# Patient Record
Sex: Female | Born: 1935 | Race: White | Hispanic: No | Smoking: Never smoker
Health system: Southern US, Community
[De-identification: ages and names within clinical notes are randomized; demographics above are authoritative.]

## PROBLEM LIST (undated history)

## (undated) DIAGNOSIS — I10 Essential (primary) hypertension: Secondary | ICD-10-CM

## (undated) DIAGNOSIS — E119 Type 2 diabetes mellitus without complications: Secondary | ICD-10-CM

---

## 2008-06-10 ENCOUNTER — Emergency Department: Payer: Self-pay | Admitting: Emergency Medicine

## 2008-07-17 ENCOUNTER — Ambulatory Visit: Payer: Self-pay | Admitting: Family Medicine

## 2008-07-23 ENCOUNTER — Ambulatory Visit: Payer: Self-pay | Admitting: Family Medicine

## 2008-08-23 ENCOUNTER — Ambulatory Visit: Payer: Self-pay | Admitting: Family Medicine

## 2008-09-22 ENCOUNTER — Ambulatory Visit: Payer: Self-pay | Admitting: Family Medicine

## 2011-01-21 ENCOUNTER — Ambulatory Visit: Payer: Self-pay | Admitting: Ophthalmology

## 2013-08-17 ENCOUNTER — Observation Stay: Payer: Self-pay | Admitting: Internal Medicine

## 2013-08-17 LAB — TROPONIN I

## 2013-08-17 LAB — CBC
HCT: 40.1 % (ref 35.0–47.0)
HGB: 13.4 g/dL (ref 12.0–16.0)
MCH: 30.9 pg (ref 26.0–34.0)
MCHC: 33.4 g/dL (ref 32.0–36.0)
MCV: 93 fL (ref 80–100)
Platelet: 132 10*3/uL — ABNORMAL LOW (ref 150–440)
RBC: 4.33 10*6/uL (ref 3.80–5.20)
RDW: 14.9 % — ABNORMAL HIGH (ref 11.5–14.5)
WBC: 8.5 10*3/uL (ref 3.6–11.0)

## 2013-08-17 LAB — COMPREHENSIVE METABOLIC PANEL
ALBUMIN: 3.5 g/dL (ref 3.4–5.0)
ALK PHOS: 53 U/L
ALT: 20 U/L (ref 12–78)
Anion Gap: 4 — ABNORMAL LOW (ref 7–16)
BUN: 22 mg/dL — AB (ref 7–18)
Bilirubin,Total: 0.3 mg/dL (ref 0.2–1.0)
CHLORIDE: 100 mmol/L (ref 98–107)
CREATININE: 1.13 mg/dL (ref 0.60–1.30)
Calcium, Total: 8.8 mg/dL (ref 8.5–10.1)
Co2: 26 mmol/L (ref 21–32)
EGFR (Non-African Amer.): 45 — ABNORMAL LOW
GFR CALC AF AMER: 52 — AB
GLUCOSE: 150 mg/dL — AB (ref 65–99)
Osmolality: 267 (ref 275–301)
POTASSIUM: 3.9 mmol/L (ref 3.5–5.1)
SGOT(AST): 24 U/L (ref 15–37)
SODIUM: 130 mmol/L — AB (ref 136–145)
Total Protein: 7.2 g/dL (ref 6.4–8.2)

## 2013-08-17 LAB — URINALYSIS, COMPLETE
BACTERIA: NONE SEEN
Bilirubin,UR: NEGATIVE
Glucose,UR: NEGATIVE mg/dL (ref 0–75)
Leukocyte Esterase: NEGATIVE
Nitrite: NEGATIVE
PH: 5 (ref 4.5–8.0)
PROTEIN: NEGATIVE
RBC,UR: 1 /HPF (ref 0–5)
Red Blood Cell Cast: 2
SPECIFIC GRAVITY: 1.016 (ref 1.003–1.030)

## 2013-08-17 LAB — CK-MB
CK-MB: <0.5 ng/mL — ABNORMAL LOW (ref 0.5–3.6)
CK-MB: 1.2 ng/mL (ref 0.5–3.6)

## 2013-08-17 LAB — CK: CK, TOTAL: 47 U/L

## 2013-08-18 LAB — CBC WITH DIFFERENTIAL/PLATELET
BASOS PCT: 0.3 %
Basophil #: 0 10*3/uL (ref 0.0–0.1)
Eosinophil #: 0 10*3/uL (ref 0.0–0.7)
Eosinophil %: 0 %
HCT: 37.1 % (ref 35.0–47.0)
HGB: 12.3 g/dL (ref 12.0–16.0)
Lymphocyte #: 0.9 10*3/uL — ABNORMAL LOW (ref 1.0–3.6)
Lymphocyte %: 15.4 %
MCH: 30.9 pg (ref 26.0–34.0)
MCHC: 33.3 g/dL (ref 32.0–36.0)
MCV: 93 fL (ref 80–100)
Monocyte #: 0.3 x10 3/mm (ref 0.2–0.9)
Monocyte %: 5.4 %
NEUTROS ABS: 4.4 10*3/uL (ref 1.4–6.5)
Neutrophil %: 78.9 %
PLATELETS: 115 10*3/uL — AB (ref 150–440)
RBC: 3.98 10*6/uL (ref 3.80–5.20)
RDW: 14.9 % — AB (ref 11.5–14.5)
WBC: 5.6 10*3/uL (ref 3.6–11.0)

## 2013-08-18 LAB — MAGNESIUM: Magnesium: 1.9 mg/dL

## 2013-08-18 LAB — BASIC METABOLIC PANEL
Anion Gap: 5 — ABNORMAL LOW (ref 7–16)
BUN: 25 mg/dL — AB (ref 7–18)
CALCIUM: 8.4 mg/dL — AB (ref 8.5–10.1)
CREATININE: 1.09 mg/dL (ref 0.60–1.30)
Chloride: 110 mmol/L — ABNORMAL HIGH (ref 98–107)
Co2: 25 mmol/L (ref 21–32)
EGFR (African American): 55 — ABNORMAL LOW
EGFR (Non-African Amer.): 47 — ABNORMAL LOW
Glucose: 156 mg/dL — ABNORMAL HIGH (ref 65–99)
OSMOLALITY: 287 (ref 275–301)
POTASSIUM: 4.3 mmol/L (ref 3.5–5.1)
Sodium: 140 mmol/L (ref 136–145)

## 2013-08-18 LAB — RAPID INFLUENZA A&B ANTIGENS

## 2014-09-15 NOTE — Discharge Summary (Signed)
PATIENT NAME:  Amy Reid, Ripley R MR#:  [id number] DATE OF BIRTH:  02/12/1930  DATE OF ADMISSION:  08/17/2013 DATE OF DISCHARGE:  08/18/2013  PRIMARY CARE PHYSICIAN: Sherrine MaplesGlenn R. Beckey DowningWillett, MD  DISCHARGE DIAGNOSES: 1.  Acute bronchitis.  2.  Influenza B. 3.  Dehydration.  4.  Hypertension.  5.  Hyperlipidemia.   CONDITION: Stable.   CODE STATUS: Full code.   HOME MEDICATIONS: Please refer to the medication reconciliation list.   DIET: Low-sodium diet.   ACTIVITY: As tolerated.   FOLLOWUP CARE: Follow up with PCP within 1 to 2 weeks.   REASON FOR ADMISSION: Dizziness and weakness.   HOSPITAL COURSE: The patient is an 81 year old Caucasian female with a history of hypertension, hyperlipidemia, presented to the ED with dizziness and weakness for 4 days. In addition, the patient has been having trouble with cough, congestion, and chest cold. She went to PCP and was placed with Augmentin. The patient took Augmentin 3 days ago, but feels it was too strong for her and her cough has not gotten better. The patient also complained of more weakness and dizziness. The patient's CAT scan of head was negative in ED. The patient's systolic blood pressure dropped from 150 to 120 on standing up. For detailed history and physical examination, please refer to record the admission note dictated by Dr. Donnita Fallsadhika. Laboratory data on admission date showed CBC and BMP were normal. Laboratory data on admission date showed BUN 22, creatinine 1.13, sodium 130. WBC 8.5, hemoglobin 13.4, platelets 132. CAT scan of head did not show chronic changes. Initial influenza test was negative; however, laboratory staff called today informing us patient has positive influenza B. The patient's urinalysis is negative.  1.  Acute bronchitis with influenza B. The patient has been treated with Levaquin for the past 2 days with p.r.n. cough medication. We will discontinue Levaquin. The patient looks like already has influenza B for more  than 5 days. There is no indication to start Tamiflu. In addition, the patient's symptoms have been improving after admission.  2.  Weakness, dizziness, possibly due to dehydration. The patient's creatinine is normal; however, BUN is a 22 and 25. The patient has been treated with IV fluids.  3.  Hypertension. Initially, the patient's lisinopril was on hold due to orthostatic hypotension. The patient's blood pressure increased to 160. We will resume lisinopril.   The patient has no complaints. Her vital signs are stable. She is clinically stable and will be discharged to home today. I discussed the patient's discharge plan with the patient and the patient's son, nurse and case manager.   TIME SPENT: About 36 minutes.   ____________________________ Shaune PollackQing Ellyse Rotolo, MD qc:jcm D: 08/18/2013 15:53:56 ET T: 08/18/2013 21:41:46 ET JOB#: [id number]  cc: Shaune PollackQing Aydin Cavalieri, MD, <Dictator> Shaune PollackQING Paislynn Hegstrom MD ELECTRONICALLY SIGNED 08/19/2013 14:19
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
## 2015-07-12 ENCOUNTER — Ambulatory Visit
Admission: RE | Admit: 2015-07-12 | Discharge: 2015-07-12 | Disposition: A | Discharge status: Home / Self Care | Payer: Medicare Other | Source: Ambulatory Visit | Attending: Family Medicine | Admitting: Family Medicine

## 2015-07-12 ENCOUNTER — Other Ambulatory Visit: Payer: Self-pay | Admitting: Family Medicine

## 2015-07-12 DIAGNOSIS — R4701 Aphasia: Secondary | ICD-10-CM

## 2016-12-17 ENCOUNTER — Emergency Department: Payer: Medicare Other

## 2016-12-17 ENCOUNTER — Encounter: Payer: Self-pay | Admitting: Emergency Medicine

## 2016-12-17 ENCOUNTER — Emergency Department
Admission: EM | Admit: 2016-12-17 | Discharge: 2016-12-17 | Disposition: A | Discharge status: Home / Self Care | Payer: Medicare Other | Attending: Emergency Medicine | Admitting: Emergency Medicine

## 2016-12-17 DIAGNOSIS — R531 Weakness: Secondary | ICD-10-CM | POA: Insufficient documentation

## 2016-12-17 DIAGNOSIS — E119 Type 2 diabetes mellitus without complications: Secondary | ICD-10-CM | POA: Insufficient documentation

## 2016-12-17 DIAGNOSIS — G459 Transient cerebral ischemic attack, unspecified: Secondary | ICD-10-CM | POA: Diagnosis not present

## 2016-12-17 DIAGNOSIS — I10 Essential (primary) hypertension: Secondary | ICD-10-CM | POA: Diagnosis not present

## 2016-12-17 DIAGNOSIS — R4701 Aphasia: Secondary | ICD-10-CM | POA: Diagnosis present

## 2016-12-17 DIAGNOSIS — Z7984 Long term (current) use of oral hypoglycemic drugs: Secondary | ICD-10-CM | POA: Insufficient documentation

## 2016-12-17 HISTORY — DX: Type 2 diabetes mellitus without complications: E11.9

## 2016-12-17 HISTORY — DX: Essential (primary) hypertension: I10

## 2016-12-17 LAB — CBC
HCT: 39 % (ref 35.0–47.0)
Hemoglobin: 13.3 g/dL (ref 12.0–16.0)
MCH: 31.3 pg (ref 26.0–34.0)
MCHC: 34 g/dL (ref 32.0–36.0)
MCV: 92.3 fL (ref 80.0–100.0)
PLATELETS: 217 10*3/uL (ref 150–440)
RBC: 4.23 MIL/uL (ref 3.80–5.20)
RDW: 14 % (ref 11.5–14.5)
WBC: 8.2 10*3/uL (ref 3.6–11.0)

## 2016-12-17 LAB — BASIC METABOLIC PANEL
Anion gap: 8 (ref 5–15)
BUN: 28 mg/dL — ABNORMAL HIGH (ref 6–20)
CO2: 27 mmol/L (ref 22–32)
CREATININE: 1.09 mg/dL — AB (ref 0.44–1.00)
Calcium: 9.9 mg/dL (ref 8.9–10.3)
Chloride: 104 mmol/L (ref 101–111)
GFR calc non Af Amer: 45 mL/min — ABNORMAL LOW (ref >60)
GFR, EST AFRICAN AMERICAN: 52 mL/min — AB (ref >60)
Glucose, Bld: 110 mg/dL — ABNORMAL HIGH (ref 65–99)
Potassium: 4.3 mmol/L (ref 3.5–5.1)
Sodium: 139 mmol/L (ref 135–145)

## 2016-12-17 LAB — GLUCOSE, CAPILLARY: Glucose-Capillary: 116 mg/dL — ABNORMAL HIGH (ref 65–99)

## 2016-12-17 LAB — TROPONIN I: Troponin I: <0.03 ng/mL (ref <0.03)

## 2016-12-17 MED ORDER — ASPIRIN 81 MG PO CHEW
324.0000 mg | CHEWABLE_TABLET | Freq: Once | ORAL | Status: AC
  Administered 2016-12-17: 324 mg via ORAL
  Filled 2016-12-17: qty 4

## 2016-12-17 NOTE — ED Notes (Signed)
MD updated patient that nurse was in with a critical patient.

## 2016-12-17 NOTE — ED Triage Notes (Signed)
Pt reports episode of sudden onset incoherent speech that lasted approximately 30 minutes yesterday at 2:30 pm. Denies headache. Speech clear in triage. Pt reports also had elevated BP when checked at home. Pt boyfriend reports CBG 151 at home. Pt reports feeling weak. Bilateral strong hand grips, facial symmetry intact. Gait steady.

## 2016-12-17 NOTE — Discharge Instructions (Signed)
Take a baby aspirin every day. If you have any numbness or weakness or difficulty speaking, or any other new or worrisome symptoms, return immediately to the emergency department.

## 2016-12-17 NOTE — ED Notes (Signed)
Patient did not have to urinate at this time but was given a specimen cup to collect a sample when available.

## 2016-12-17 NOTE — ED Provider Notes (Addendum)
[facility] Emergency Department Provider Note  ____________________________________________   I have reviewed the triage vital signs and the nursing notes.   HISTORY  Chief Complaint Weakness; Hypertension; and Possible CVA    HPI Amy Reid is a 83 y.o. female who presents today with no complaint at this time but states that yesterday, for about a half hour around 6 PM, she had garbled speech and difficulty with word fine. Her colleague, who was with her, states that she was having great deal difficulty speaking. She was not otherwise disoriented however and did not have any focal numbness or weakness. About 30 minutes after thisshe was back to normal and has been since. She did notice her blood pressure was mildly elevated she states she was under increased stress. She has no complaints at this time. Symptoms resolved on their own nothing made it better and nothing made it worse. She had no pain.    Past Medical History:  Diagnosis Date  . Diabetes mellitus without complication (HCC)   . Hypertension     There are no active problems to display for this patient.   No past surgical history on file.  Prior to Admission medications   Medication Sig Start Date End Date Taking? Authorizing Provider  cetirizine (ZYRTEC) 10 MG tablet Take 10 mg by mouth daily as needed for allergies.   Yes [provider]  latanoprost (XALATAN) 0.005 % ophthalmic solution Place 1 drop into both eyes at bedtime.   Yes [provider]  lisinopril-hydrochlorothiazide (PRINZIDE,ZESTORETIC) 20-12.5 MG tablet Take 1 tablet by mouth at bedtime.  11/11/16  Yes [provider]  lovastatin (MEVACOR) 20 MG tablet Take 20 mg by mouth at bedtime.  11/11/16  Yes [provider]  metFORMIN (GLUCOPHAGE-XR) 500 MG 24 hr tablet Take 500 mg by mouth at bedtime.  10/13/16  Yes [provider]    Allergies Epinephrine  No family history on  file.  Social History Social History  Substance Use Topics  . Smoking status: Not on file  . Smokeless tobacco: Not on file  . Alcohol use Not on file    Review of Systems Constitutional: No fever/chills Eyes: No visual changes. ENT: No sore throat. No stiff neck no neck pain Cardiovascular: Denies chest pain. Respiratory: Denies shortness of breath. Gastrointestinal:   no vomiting.  No diarrhea.  No constipation. Genitourinary: Negative for dysuria. Musculoskeletal: Negative lower extremity swelling Skin: Negative for rash. Neurological: Negative for severe headaches, focal weakness or numbness.   ____________________________________________   PHYSICAL EXAM:  VITAL SIGNS: ED Triage Vitals [12/17/16 1021]  Enc Vitals Group     BP (!) 140/58     Pulse Rate 79     Resp 18     Temp 97.8 F (36.6 C)     Temp Source Oral     SpO2 95 %     Weight 125 lb (56.7 kg)     Height 5\' 4"  (1.626 m)     Head Circumference      Peak Flow      Pain Score      Pain Loc      Pain Edu?      Excl. in GC?     Constitutional: Alert and oriented. Well appearing and in no acute distress. Eyes: Conjunctivae are normal Head: Atraumatic HEENT: No congestion/rhinnorhea. Mucous membranes are moist.  Oropharynx non-erythematous Neck:   Nontender with no meningismus, no masses, no stridor Cardiovascular: Normal rate, regular rhythm. Grossly  normal heart sounds.  Good peripheral circulation. Respiratory: Normal respiratory effort.  No retractions. Lungs CTAB. Abdominal: Soft and nontender. No distention. No guarding no rebound Back:  There is no focal tenderness or step off.  there is no midline tenderness there are no lesions noted. there is no CVA tenderness Musculoskeletal: No lower extremity tenderness, no upper extremity tenderness. No joint effusions, no DVT signs strong distal pulses no edema Neurologic: Cranial nerves II through XII are grossly intact 5 out of 5 strength bilateral  upper and lower extremity. Finger to nose within normal limits heel to shin within normal limits, speech is normal with no word finding difficulty or dysarthria, reflexes symmetric, pupils are equally round and reactive to light, there is no pronator drift, sensation is normal, vision is intact to confrontation, gait is deferred, there is no nystagmus, normal neurologic exam Skin:  Skin is warm, dry and intact. No rash noted. Psychiatric: Mood and affect are normal. Speech and behavior are normal.  ____________________________________________   LABS (all labs ordered are listed, but only abnormal results are displayed)  Labs Reviewed  BASIC METABOLIC PANEL - Abnormal; Notable for the following:       Result Value   Glucose, Bld 110 (*)    BUN 28 (*)    Creatinine, Ser 1.09 (*)    GFR calc non Af Amer 45 (*)    GFR calc Af Amer 52 (*)    All other components within normal limits  GLUCOSE, CAPILLARY - Abnormal; Notable for the following:    Glucose-Capillary 116 (*)    All other components within normal limits  CBC  TROPONIN I  URINALYSIS, COMPLETE (UACMP) WITH MICROSCOPIC  CBG MONITORING, ED   ____________________________________________  EKG  I personally interpreted any EKGs ordered by me or triage  ____________________________________________  RADIOLOGY  I reviewed any imaging ordered by me or triage that were performed during my shift and, if possible, patient and/or family made aware of any abnormal findings. ____________________________________________   PROCEDURES  Procedure(s) performed: None  Procedures  Critical Care performed: None  ____________________________________________   INITIAL IMPRESSION / ASSESSMENT AND PLAN / ED COURSE  Pertinent labs & imaging results that were available during my care of the patient were reviewed by me and considered in my medical decision making (see chart for details).  Discussed with Dr. Thad Rangereynolds, she feels that the  patient's MRI is negative and given that she has an NIH stroke scale of 0 and this happened yesterday, Sunday the patient on baby aspirin and having close outpatient follow-up would not be an unreasonable thing as long as her MRI is negative. We will obtain MRI and reassess. ----------------------------------------- 3:44 PM on 12/17/2016 -----------------------------------------  , Son who is at bedside, states that she has had 2 prior episodes at least possibly 3 over the years always gets better, she is back to her baseline still, NIH stroke scale still 0, MRI negative, discussed with her primary care physician PA, who agrees with management and will see patient in clinic tomorrow. Patient will be given aspirin here, advised to take a baby aspirin every day and she will follow closely with primary care. Return precautions and follow-up given and understood   ____________________________________________   FINAL CLINICAL IMPRESSION(S) / ED DIAGNOSES  Final diagnoses:  None      This chart was dictated using voice recognition software.  Despite best efforts to proofread,  errors can occur which can change meaning.      Jeanmarie PlantMcShane, Sofi Bryars A,  MD 12/17/16 1246    Jeanmarie Plant, MD 12/17/16 [phone]

## 2019-04-15 ENCOUNTER — Inpatient Hospital Stay: Payer: Medicare Other

## 2019-04-15 ENCOUNTER — Inpatient Hospital Stay
Admission: EM | Admit: 2019-04-15 | Discharge: 2019-04-21 | DRG: 481 | Disposition: A | Discharge status: Skilled Nursing Facility | Payer: Medicare Other | Attending: Internal Medicine | Admitting: Internal Medicine

## 2019-04-15 ENCOUNTER — Emergency Department: Payer: Medicare Other

## 2019-04-15 ENCOUNTER — Encounter: Payer: Self-pay | Admitting: Internal Medicine

## 2019-04-15 ENCOUNTER — Inpatient Hospital Stay: Payer: Medicare Other | Admitting: Anesthesiology

## 2019-04-15 ENCOUNTER — Encounter
Admission: EM | Disposition: A | Discharge status: Skilled Nursing Facility | Payer: Self-pay | Source: Home / Self Care | Attending: Internal Medicine

## 2019-04-15 ENCOUNTER — Other Ambulatory Visit: Payer: Self-pay

## 2019-04-15 DIAGNOSIS — R55 Syncope and collapse: Secondary | ICD-10-CM | POA: Diagnosis present

## 2019-04-15 DIAGNOSIS — S72141A Displaced intertrochanteric fracture of right femur, initial encounter for closed fracture: Principal | ICD-10-CM | POA: Diagnosis present

## 2019-04-15 DIAGNOSIS — E785 Hyperlipidemia, unspecified: Secondary | ICD-10-CM

## 2019-04-15 DIAGNOSIS — I10 Essential (primary) hypertension: Secondary | ICD-10-CM | POA: Diagnosis present

## 2019-04-15 DIAGNOSIS — E1142 Type 2 diabetes mellitus with diabetic polyneuropathy: Secondary | ICD-10-CM

## 2019-04-15 DIAGNOSIS — E1169 Type 2 diabetes mellitus with other specified complication: Secondary | ICD-10-CM

## 2019-04-15 DIAGNOSIS — I951 Orthostatic hypotension: Secondary | ICD-10-CM | POA: Diagnosis present

## 2019-04-15 DIAGNOSIS — E1121 Type 2 diabetes mellitus with diabetic nephropathy: Secondary | ICD-10-CM | POA: Diagnosis not present

## 2019-04-15 DIAGNOSIS — S72142A Displaced intertrochanteric fracture of left femur, initial encounter for closed fracture: Secondary | ICD-10-CM | POA: Diagnosis not present

## 2019-04-15 DIAGNOSIS — E119 Type 2 diabetes mellitus without complications: Secondary | ICD-10-CM

## 2019-04-15 DIAGNOSIS — N179 Acute kidney failure, unspecified: Secondary | ICD-10-CM | POA: Diagnosis present

## 2019-04-15 DIAGNOSIS — D62 Acute posthemorrhagic anemia: Secondary | ICD-10-CM | POA: Diagnosis not present

## 2019-04-15 DIAGNOSIS — Z7982 Long term (current) use of aspirin: Secondary | ICD-10-CM

## 2019-04-15 DIAGNOSIS — E1122 Type 2 diabetes mellitus with diabetic chronic kidney disease: Secondary | ICD-10-CM | POA: Diagnosis present

## 2019-04-15 DIAGNOSIS — Z809 Family history of malignant neoplasm, unspecified: Secondary | ICD-10-CM | POA: Diagnosis not present

## 2019-04-15 DIAGNOSIS — S72001S Fracture of unspecified part of neck of right femur, sequela: Secondary | ICD-10-CM | POA: Diagnosis not present

## 2019-04-15 DIAGNOSIS — S72001A Fracture of unspecified part of neck of right femur, initial encounter for closed fracture: Secondary | ICD-10-CM | POA: Diagnosis not present

## 2019-04-15 DIAGNOSIS — E860 Dehydration: Secondary | ICD-10-CM | POA: Diagnosis present

## 2019-04-15 DIAGNOSIS — Z20828 Contact with and (suspected) exposure to other viral communicable diseases: Secondary | ICD-10-CM | POA: Diagnosis present

## 2019-04-15 DIAGNOSIS — I679 Cerebrovascular disease, unspecified: Secondary | ICD-10-CM | POA: Diagnosis present

## 2019-04-15 DIAGNOSIS — I129 Hypertensive chronic kidney disease with stage 1 through stage 4 chronic kidney disease, or unspecified chronic kidney disease: Secondary | ICD-10-CM | POA: Diagnosis present

## 2019-04-15 DIAGNOSIS — I471 Supraventricular tachycardia, unspecified: Secondary | ICD-10-CM

## 2019-04-15 DIAGNOSIS — Y92012 Bathroom of single-family (private) house as the place of occurrence of the external cause: Secondary | ICD-10-CM | POA: Diagnosis not present

## 2019-04-15 DIAGNOSIS — Z8673 Personal history of transient ischemic attack (TIA), and cerebral infarction without residual deficits: Secondary | ICD-10-CM | POA: Diagnosis not present

## 2019-04-15 DIAGNOSIS — N1831 Chronic kidney disease, stage 3a: Secondary | ICD-10-CM | POA: Diagnosis present

## 2019-04-15 DIAGNOSIS — Z7984 Long term (current) use of oral hypoglycemic drugs: Secondary | ICD-10-CM

## 2019-04-15 DIAGNOSIS — Z833 Family history of diabetes mellitus: Secondary | ICD-10-CM

## 2019-04-15 DIAGNOSIS — Z888 Allergy status to other drugs, medicaments and biological substances status: Secondary | ICD-10-CM | POA: Diagnosis not present

## 2019-04-15 DIAGNOSIS — E1140 Type 2 diabetes mellitus with diabetic neuropathy, unspecified: Secondary | ICD-10-CM | POA: Diagnosis present

## 2019-04-15 DIAGNOSIS — M25551 Pain in right hip: Secondary | ICD-10-CM | POA: Diagnosis present

## 2019-04-15 DIAGNOSIS — W010XXA Fall on same level from slipping, tripping and stumbling without subsequent striking against object, initial encounter: Secondary | ICD-10-CM | POA: Diagnosis present

## 2019-04-15 DIAGNOSIS — S72001D Fracture of unspecified part of neck of right femur, subsequent encounter for closed fracture with routine healing: Secondary | ICD-10-CM | POA: Diagnosis not present

## 2019-04-15 DIAGNOSIS — S72009A Fracture of unspecified part of neck of unspecified femur, initial encounter for closed fracture: Secondary | ICD-10-CM

## 2019-04-15 HISTORY — PX: INTRAMEDULLARY (IM) NAIL INTERTROCHANTERIC: SHX5875

## 2019-04-15 LAB — CBC WITH DIFFERENTIAL/PLATELET
Abs Immature Granulocytes: 0.05 10*3/uL (ref 0.00–0.07)
Basophils Absolute: 0 10*3/uL (ref 0.0–0.1)
Basophils Relative: 0 %
Eosinophils Absolute: 0.3 10*3/uL (ref 0.0–0.5)
Eosinophils Relative: 3 %
HCT: 37.6 % (ref 36.0–46.0)
Hemoglobin: 13.1 g/dL (ref 12.0–15.0)
Immature Granulocytes: 1 %
Lymphocytes Relative: 23 %
Lymphs Abs: 2.3 10*3/uL (ref 0.7–4.0)
MCH: 30.9 pg (ref 26.0–34.0)
MCHC: 34.8 g/dL (ref 30.0–36.0)
MCV: 88.7 fL (ref 80.0–100.0)
Monocytes Absolute: 0.8 10*3/uL (ref 0.1–1.0)
Monocytes Relative: 8 %
Neutro Abs: 6.6 10*3/uL (ref 1.7–7.7)
Neutrophils Relative %: 65 %
Platelets: 173 10*3/uL (ref 150–400)
RBC: 4.24 MIL/uL (ref 3.87–5.11)
RDW: 15 % (ref 11.5–15.5)
WBC: 10 10*3/uL (ref 4.0–10.5)
nRBC: 0 % (ref 0.0–0.2)

## 2019-04-15 LAB — BASIC METABOLIC PANEL
Anion gap: 10 (ref 5–15)
BUN: 39 mg/dL — ABNORMAL HIGH (ref 8–23)
CO2: 24 mmol/L (ref 22–32)
Calcium: 9.4 mg/dL (ref 8.9–10.3)
Chloride: 104 mmol/L (ref 98–111)
Creatinine, Ser: 1.39 mg/dL — ABNORMAL HIGH (ref 0.44–1.00)
GFR calc Af Amer: 39 mL/min — ABNORMAL LOW (ref >60)
GFR calc non Af Amer: 34 mL/min — ABNORMAL LOW (ref >60)
Glucose, Bld: 164 mg/dL — ABNORMAL HIGH (ref 70–99)
Potassium: 3.5 mmol/L (ref 3.5–5.1)
Sodium: 138 mmol/L (ref 135–145)

## 2019-04-15 LAB — TROPONIN I (HIGH SENSITIVITY): Troponin I (High Sensitivity): 7 ng/L (ref <18)

## 2019-04-15 LAB — HEPATIC FUNCTION PANEL
ALT: 18 U/L (ref 0–44)
AST: 24 U/L (ref 15–41)
Albumin: 3.9 g/dL (ref 3.5–5.0)
Alkaline Phosphatase: 42 U/L (ref 38–126)
Bilirubin, Direct: <0.1 mg/dL (ref 0.0–0.2)
Total Bilirubin: 0.7 mg/dL (ref 0.3–1.2)
Total Protein: 6.8 g/dL (ref 6.5–8.1)

## 2019-04-15 LAB — SARS CORONAVIRUS 2 BY RT PCR (HOSPITAL ORDER, PERFORMED IN ~~LOC~~ HOSPITAL LAB): SARS Coronavirus 2: NEGATIVE

## 2019-04-15 LAB — GLUCOSE, CAPILLARY: Glucose-Capillary: 116 mg/dL — ABNORMAL HIGH (ref 70–99)

## 2019-04-15 SURGERY — FIXATION, FRACTURE, INTERTROCHANTERIC, WITH INTRAMEDULLARY ROD
Anesthesia: Spinal | Laterality: Right

## 2019-04-15 MED ORDER — KETAMINE HCL 10 MG/ML IJ SOLN
INTRAMUSCULAR | Status: DC | PRN
  Administered 2019-04-15: 20 mg via INTRAVENOUS

## 2019-04-15 MED ORDER — CEFAZOLIN SODIUM-DEXTROSE 1-4 GM/50ML-% IV SOLN
1.0000 g | Freq: Three times a day (TID) | INTRAVENOUS | Status: AC
  Administered 2019-04-15 – 2019-04-16 (×3): 1 g via INTRAVENOUS
  Filled 2019-04-15 (×3): qty 50

## 2019-04-15 MED ORDER — KETAMINE HCL 50 MG/ML IJ SOLN
INTRAMUSCULAR | Status: AC
  Filled 2019-04-15: qty 10

## 2019-04-15 MED ORDER — MIDAZOLAM HCL 5 MG/5ML IJ SOLN
INTRAMUSCULAR | Status: DC | PRN
  Administered 2019-04-15: 1 mg via INTRAVENOUS

## 2019-04-15 MED ORDER — OXYCODONE HCL 5 MG PO TABS
5.0000 mg | ORAL_TABLET | ORAL | Status: DC | PRN
  Administered 2019-04-15 – 2019-04-20 (×7): 5 mg via ORAL
  Filled 2019-04-15 (×7): qty 1

## 2019-04-15 MED ORDER — SODIUM CHLORIDE 0.9% FLUSH
3.0000 mL | Freq: Two times a day (BID) | INTRAVENOUS | Status: DC
  Administered 2019-04-17 – 2019-04-20 (×5): 3 mL via INTRAVENOUS

## 2019-04-15 MED ORDER — SODIUM CHLORIDE 0.9 % IV SOLN
8.0000 mg | Freq: Three times a day (TID) | INTRAVENOUS | Status: DC | PRN
  Filled 2019-04-15: qty 4

## 2019-04-15 MED ORDER — PROPOFOL 500 MG/50ML IV EMUL
INTRAVENOUS | Status: DC | PRN
  Administered 2019-04-15: 75 ug/kg/min via INTRAVENOUS

## 2019-04-15 MED ORDER — FENTANYL CITRATE (PF) 100 MCG/2ML IJ SOLN
INTRAMUSCULAR | Status: AC
  Filled 2019-04-15: qty 2

## 2019-04-15 MED ORDER — PHENYLEPHRINE HCL (PRESSORS) 10 MG/ML IV SOLN
INTRAVENOUS | Status: DC | PRN
  Administered 2019-04-15 (×5): 100 ug via INTRAVENOUS

## 2019-04-15 MED ORDER — GABAPENTIN 100 MG PO CAPS
100.0000 mg | ORAL_CAPSULE | Freq: Every day | ORAL | Status: DC
  Administered 2019-04-15 – 2019-04-20 (×6): 100 mg via ORAL
  Filled 2019-04-15 (×6): qty 1

## 2019-04-15 MED ORDER — OXYCODONE HCL 5 MG PO TABS: 10.0000 mg | ORAL_TABLET | ORAL | Status: DC | PRN

## 2019-04-15 MED ORDER — FENTANYL CITRATE (PF) 100 MCG/2ML IJ SOLN
50.0000 ug | INTRAMUSCULAR | Status: DC | PRN
  Administered 2019-04-15: 08:00:00 50 ug via INTRAVENOUS
  Filled 2019-04-15: qty 2

## 2019-04-15 MED ORDER — OXYCODONE HCL 5 MG/5ML PO SOLN: 5.0000 mg | Freq: Once | ORAL | Status: DC | PRN

## 2019-04-15 MED ORDER — LACTATED RINGERS IV SOLN
INTRAVENOUS | Status: AC
  Administered 2019-04-15 (×4): via INTRAVENOUS

## 2019-04-15 MED ORDER — HYDROMORPHONE HCL 1 MG/ML IJ SOLN
0.5000 mg | Freq: Four times a day (QID) | INTRAMUSCULAR | Status: DC | PRN
  Administered 2019-04-15: 10:00:00 0.5 mg via INTRAVENOUS
  Filled 2019-04-15: qty 1

## 2019-04-15 MED ORDER — SODIUM CHLORIDE (PF) 0.9 % IJ SOLN
INTRAMUSCULAR | Status: AC
  Filled 2019-04-15: qty 50

## 2019-04-15 MED ORDER — OXYCODONE HCL 5 MG PO TABS: 5.0000 mg | ORAL_TABLET | Freq: Once | ORAL | Status: DC | PRN

## 2019-04-15 MED ORDER — EPHEDRINE SULFATE 50 MG/ML IJ SOLN
INTRAMUSCULAR | Status: AC
  Filled 2019-04-15: qty 1

## 2019-04-15 MED ORDER — BIOTIN 10 MG PO CAPS: 10.0000 mg | ORAL_CAPSULE | Freq: Every day | ORAL | Status: DC

## 2019-04-15 MED ORDER — IBUPROFEN 400 MG PO TABS: 800.0000 mg | ORAL_TABLET | Freq: Three times a day (TID) | ORAL | Status: DC

## 2019-04-15 MED ORDER — BUPIVACAINE HCL (PF) 0.5 % IJ SOLN
INTRAMUSCULAR | Status: DC | PRN
  Administered 2019-04-15: 3 mL

## 2019-04-15 MED ORDER — EPHEDRINE SULFATE 50 MG/ML IJ SOLN
INTRAMUSCULAR | Status: DC | PRN
  Administered 2019-04-15: 10 mg via INTRAVENOUS

## 2019-04-15 MED ORDER — PROPOFOL 500 MG/50ML IV EMUL
INTRAVENOUS | Status: AC
  Filled 2019-04-15: qty 50

## 2019-04-15 MED ORDER — MIDAZOLAM HCL 2 MG/2ML IJ SOLN
INTRAMUSCULAR | Status: AC
  Filled 2019-04-15: qty 2

## 2019-04-15 MED ORDER — ACETAMINOPHEN 500 MG PO TABS
1000.0000 mg | ORAL_TABLET | Freq: Once | ORAL | Status: AC
  Administered 2019-04-15: 09:00:00 1000 mg via ORAL
  Filled 2019-04-15: qty 2

## 2019-04-15 MED ORDER — CHLORHEXIDINE GLUCONATE CLOTH 2 % EX PADS
6.0000 | MEDICATED_PAD | Freq: Every day | CUTANEOUS | Status: DC
  Administered 2019-04-15: 21:00:00 6 via TOPICAL

## 2019-04-15 MED ORDER — SODIUM CHLORIDE 0.9 % IV BOLUS
500.0000 mL | Freq: Once | INTRAVENOUS | Status: AC
  Administered 2019-04-15: 09:00:00 500 mL via INTRAVENOUS

## 2019-04-15 MED ORDER — CEFAZOLIN SODIUM 1 G IJ SOLR
INTRAMUSCULAR | Status: AC
  Filled 2019-04-15: qty 20

## 2019-04-15 MED ORDER — HEPARIN SODIUM (PORCINE) 5000 UNIT/ML IJ SOLN: 5000.0000 [IU] | Freq: Three times a day (TID) | INTRAMUSCULAR | Status: DC

## 2019-04-15 MED ORDER — BACITRACIN 50000 UNITS IM SOLR
INTRAMUSCULAR | Status: AC
  Filled 2019-04-15: qty 1

## 2019-04-15 MED ORDER — ACETAMINOPHEN 500 MG PO TABS
1000.0000 mg | ORAL_TABLET | Freq: Three times a day (TID) | ORAL | Status: DC
  Administered 2019-04-15 – 2019-04-21 (×15): 1000 mg via ORAL
  Filled 2019-04-15 (×15): qty 2

## 2019-04-15 MED ORDER — LIDOCAINE HCL (PF) 2 % IJ SOLN
INTRAMUSCULAR | Status: AC
  Filled 2019-04-15: qty 10

## 2019-04-15 MED ORDER — CEFAZOLIN SODIUM-DEXTROSE 2-3 GM-%(50ML) IV SOLR
INTRAVENOUS | Status: DC | PRN
  Administered 2019-04-15: 2 g via INTRAVENOUS

## 2019-04-15 MED ORDER — INSULIN ASPART 100 UNIT/ML ~~LOC~~ SOLN
0.0000 [IU] | Freq: Three times a day (TID) | SUBCUTANEOUS | Status: DC
  Administered 2019-04-16 (×2): 1 [IU] via SUBCUTANEOUS
  Administered 2019-04-16: 17:00:00 2 [IU] via SUBCUTANEOUS
  Administered 2019-04-17 – 2019-04-18 (×3): 1 [IU] via SUBCUTANEOUS
  Administered 2019-04-18: 2 [IU] via SUBCUTANEOUS
  Administered 2019-04-18 – 2019-04-20 (×6): 1 [IU] via SUBCUTANEOUS
  Administered 2019-04-20: 12:00:00 2 [IU] via SUBCUTANEOUS
  Administered 2019-04-21: 1 [IU] via SUBCUTANEOUS
  Filled 2019-04-15 (×15): qty 1

## 2019-04-15 MED ORDER — ENOXAPARIN SODIUM 40 MG/0.4ML ~~LOC~~ SOLN: 40.0000 mg | SUBCUTANEOUS | Status: DC

## 2019-04-15 MED ORDER — LATANOPROST 0.005 % OP SOLN
1.0000 [drp] | Freq: Every day | OPHTHALMIC | Status: DC
  Administered 2019-04-15 – 2019-04-20 (×4): 1 [drp] via OPHTHALMIC
  Filled 2019-04-15 (×2): qty 2.5

## 2019-04-15 MED ORDER — FENTANYL CITRATE (PF) 100 MCG/2ML IJ SOLN: 25.0000 ug | INTRAMUSCULAR | Status: DC | PRN

## 2019-04-15 SURGICAL SUPPLY — 40 items
BIT DRILL CALIBRATED 4.2 (BIT) ×1 IMPLANT
BNDG COHESIVE 4X5 TAN STRL (GAUZE/BANDAGES/DRESSINGS) ×3 IMPLANT
CANISTER SUCT 1200ML W/VALVE (MISCELLANEOUS) ×3 IMPLANT
CHLORAPREP W/TINT 26 (MISCELLANEOUS) ×6 IMPLANT
COVER WAND RF STERILE (DRAPES) ×3 IMPLANT
DRAPE C-ARMOR (DRAPES) IMPLANT
DRAPE INCISE 23X17 IOBAN STRL (DRAPES) ×2
DRAPE INCISE IOBAN 23X17 STRL (DRAPES) ×1 IMPLANT
DRILL BIT CALIBRATED 4.2 (BIT) ×3
DRSG AQUACEL AG ADV 3.5X10 (GAUZE/BANDAGES/DRESSINGS) IMPLANT
DRSG AQUACEL AG ADV 3.5X14 (GAUZE/BANDAGES/DRESSINGS) IMPLANT
DRSG TEGADERM 4X4.75 (GAUZE/BANDAGES/DRESSINGS) ×12 IMPLANT
ELECT REM PT RETURN 9FT ADLT (ELECTROSURGICAL) ×3
ELECTRODE REM PT RTRN 9FT ADLT (ELECTROSURGICAL) ×1 IMPLANT
GAUZE SPONGE 4X4 12PLY STRL (GAUZE/BANDAGES/DRESSINGS) ×3 IMPLANT
GAUZE XEROFORM 1X8 LF (GAUZE/BANDAGES/DRESSINGS) IMPLANT
GLOVE INDICATOR 8.0 STRL GRN (GLOVE) ×3 IMPLANT
GLOVE SURG ORTHO 8.5 STRL (GLOVE) ×3 IMPLANT
GOWN STRL REUS W/ TWL LRG LVL3 (GOWN DISPOSABLE) ×1 IMPLANT
GOWN STRL REUS W/TWL LRG LVL3 (GOWN DISPOSABLE) ×2
GOWN STRL REUS W/TWL LRG LVL4 (GOWN DISPOSABLE) ×3 IMPLANT
GUIDEWIRE 3.2X400 (WIRE) ×3 IMPLANT
KIT TURNOVER KIT A (KITS) ×3 IMPLANT
MAT ABSORB  FLUID 56X50 GRAY (MISCELLANEOUS) ×2
MAT ABSORB FLUID 56X50 GRAY (MISCELLANEOUS) ×1 IMPLANT
NAIL CANN TFNA 9MM/130 DEG TI (Nail) ×3 IMPLANT
NEEDLE SPNL 18GX3.5 QUINCKE PK (NEEDLE) ×3 IMPLANT
NS IRRIG 500ML POUR BTL (IV SOLUTION) ×3 IMPLANT
PACK HIP COMPR (MISCELLANEOUS) ×3 IMPLANT
SCREW FENES TFNA 100 ST (Screw) ×3 IMPLANT
SCREW LOCK STAR 5X32 (Screw) ×3 IMPLANT
SOL PREP PVP 2OZ (MISCELLANEOUS) ×3
SOLUTION PREP PVP 2OZ (MISCELLANEOUS) ×1 IMPLANT
STAPLER SKIN PROX 35W (STAPLE) ×3 IMPLANT
SUCTION FRAZIER HANDLE 10FR (MISCELLANEOUS) ×2
SUCTION TUBE FRAZIER 10FR DISP (MISCELLANEOUS) ×1 IMPLANT
SUT VIC AB 0 CT1 36 (SUTURE) ×3 IMPLANT
SUT VIC AB 2-0 CT1 27 (SUTURE) ×2
SUT VIC AB 2-0 CT1 TAPERPNT 27 (SUTURE) ×1 IMPLANT
SYR 30ML LL (SYRINGE) ×3 IMPLANT

## 2019-04-15 NOTE — ED Notes (Signed)
Called lab and informed them to change the covid swab to a rapid- stated they would run it

## 2019-04-15 NOTE — Anesthesia Procedure Notes (Signed)
Performed by: Zakayla Martinec, CRNA       

## 2019-04-15 NOTE — Anesthesia Post-op Follow-up Note (Signed)
Anesthesia QCDR form completed.        

## 2019-04-15 NOTE — Progress Notes (Signed)
15 minute call to floor. 

## 2019-04-15 NOTE — ED Notes (Signed)
Consent signed by son. Pt taken to OR by transporter.

## 2019-04-15 NOTE — Transfer of Care (Signed)
Immediate Anesthesia Transfer of Care Note  Patient: Amy Reid  Procedure(s) Performed: INTRAMEDULLARY (IM) NAIL INTERTROCHANTRIC (Right )  Patient Location: PACU  Anesthesia Type:Spinal  Level of Consciousness: drowsy  Airway & Oxygen Therapy: Patient Spontanous Breathing  Post-op Assessment: Report given to RN  Post vital signs: stable  Last Vitals:  Vitals Value Taken Time  BP    Temp    Pulse 85 04/15/19 1400  Resp    SpO2 98 % 04/15/19 1400  Vitals shown include unvalidated device data.  Last Pain:  Vitals:   04/15/19 0958  TempSrc:   PainSc: 8          Complications: No apparent anesthesia complications

## 2019-04-15 NOTE — Anesthesia Procedure Notes (Signed)
Spinal  Patient location during procedure: OR Start time: 04/15/2019 12:14 PM End time: 04/15/2019 12:19 PM Staffing Anesthesiologist: Adeli Frost, Precious Haws, MD Performed: anesthesiologist  Preanesthetic Checklist Completed: patient identified, site marked, surgical consent, pre-op evaluation, timeout performed, IV checked, risks and benefits discussed and monitors and equipment checked Spinal Block Patient position: sitting Prep: ChloraPrep Patient monitoring: heart rate, continuous pulse ox, blood pressure and cardiac monitor Approach: midline Location: L3-4 Injection technique: single-shot Needle Needle type: Whitacre and Introducer  Needle gauge: 24 G Needle length: 9 cm Assessment Sensory level: T10 Additional Notes Negative paresthesia. Negative blood return. Positive free-flowing CSF. Expiration date of kit checked and confirmed. Patient tolerated procedure well, without complications.
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
## 2019-04-15 NOTE — ED Notes (Signed)
Pt in xray

## 2019-04-15 NOTE — ED Notes (Signed)
Report given to Kate, RN

## 2019-04-15 NOTE — Consult Note (Signed)
Reason for Consult:RIGHT HIP FRACTURE Referring Physician: Dr. Irena CordsEkta Patel  Milana ObeyFrances R Reid is an 85 y.o. female.  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 Pt had immediate hip pain and inability to ambulate w/ a deformity. Pt has been seen by hospitalist and deemed optimized for surgical intervention.   Denies any numbness/tingling. No other extremity pain.   Past Medical History:  Diagnosis Date  . Diabetes mellitus without complication (HCC)   . Hypertension     History reviewed. No pertinent surgical history.  Family History  Problem Relation Age of Onset  . Diabetes Mother   . Cancer Father     Social History:  reports that she has never smoked. She has never used smokeless tobacco. She reports that she does not drink alcohol or use drugs.  Allergies:  Allergies  Allergen Reactions  . Epinephrine Other (See Comments)    coma    Medications: I have reviewed the patient's current medications.  Results for orders placed or performed during the hospital encounter of 04/15/19 (from the past 48 hour(s))  CBC with Differential     Status: None   Collection Time: 04/15/19  6:38 AM  Result Value Ref Range   WBC 10.0 4.0 - 10.5 K/uL   RBC 4.24 3.87 - 5.11 MIL/uL   Hemoglobin 13.1 12.0 - 15.0 g/dL   HCT 69.637.6 29.536.0 - 28.446.0 %   MCV 88.7 80.0 - 100.0 fL   MCH 30.9 26.0 - 34.0 pg   MCHC 34.8 30.0 - 36.0 g/dL   RDW 13.215.0 44.011.5 - 10.215.5 %   Platelets 173 150 - 400 K/uL   nRBC 0.0 0.0 - 0.2 %   Neutrophils Relative % 65 %   Neutro Abs 6.6 1.7 - 7.7 K/uL   Lymphocytes Relative 23 %   Lymphs Abs 2.3 0.7 - 4.0 K/uL   Monocytes Relative 8 %   Monocytes Absolute 0.8 0.1 - 1.0 K/uL   Eosinophils Relative 3 %   Eosinophils Absolute 0.3 0.0 - 0.5 K/uL   Basophils Relative 0 %   Basophils Absolute 0.0 0.0 - 0.1 K/uL   Immature Granulocytes 1 %   Abs Immature Granulocytes 0.05 0.00 - 0.07 K/uL    Comment: Performed at  [facility], [address] [place], [place] [address]  Basic metabolic panel     Status: Abnormal   Collection Time: 04/15/19  6:38 AM  Result Value Ref Range   Sodium 138 135 - 145 mmol/L   Potassium 3.5 3.5 - 5.1 mmol/L   Chloride 104 98 - 111 mmol/L   CO2 24 22 - 32 mmol/L   Glucose, Bld 164 (H) 70 - 99 mg/dL   BUN 39 (H) 8 - 23 mg/dL   Creatinine, Ser 6.441.39 (H) 0.44 - 1.00 mg/dL   Calcium 9.4 8.9 - 03.410.3 mg/dL   GFR calc non Af Amer 34 (L) >60 mL/min   GFR calc Af Amer 39 (L) >60 mL/min   Anion gap 10 5 - 15    Comment: Performed at [facility], [address] [place], [place] [address]  Hepatic function panel     Status: None   Collection Time: 04/15/19  6:38 AM  Result Value Ref Range   Total Protein 6.8 6.5 - 8.1 g/dL   Albumin 3.9 3.5 - 5.0 g/dL   AST 24 15 - 41 U/L   ALT 18 0 - 44 U/L  Alkaline Phosphatase 42 38 - 126 U/L   Total Bilirubin 0.7 0.3 - 1.2 mg/dL   Bilirubin, Direct <0.1 0.0 - 0.2 mg/dL   Indirect Bilirubin NOT CALCULATED 0.3 - 0.9 mg/dL    Comment: Performed at [facility], [place], [place] [address]  Troponin I (High Sensitivity)     Status: None   Collection Time: 04/15/19  6:38 AM  Result Value Ref Range   Troponin I (High Sensitivity) 7 <18 ng/L    Comment: (NOTE) Elevated high sensitivity troponin I (hsTnI) values and significant  changes across serial measurements may suggest ACS but many other  chronic and acute conditions are known to elevate hsTnI results.  Refer to the "Links" section for chest pain algorithms and additional  guidance. Performed at [facility], [address] [place], [place] [address]   SARS Coronavirus 2 by RT PCR (hospital order, performed in [facility] hospital lab) Nasopharyngeal Nasopharyngeal Swab     Status: None   Collection Time: 04/15/19  8:09 AM   Specimen: Nasopharyngeal Swab  Result Value Ref Range   SARS Coronavirus 2 NEGATIVE NEGATIVE     Comment: (NOTE) If result is NEGATIVE SARS-CoV-2 target nucleic acids are NOT DETECTED. The SARS-CoV-2 RNA is generally detectable in upper and lower  respiratory specimens during the acute phase of infection. The lowest  concentration of SARS-CoV-2 viral copies this assay can detect is 250  copies / mL. A negative result does not preclude SARS-CoV-2 infection  and should not be used as the sole basis for treatment or other  patient management decisions.  A negative result may occur with  improper specimen collection / handling, submission of specimen other  than nasopharyngeal swab, presence of viral mutation(s) within the  areas targeted by this assay, and inadequate number of viral copies  (<250 copies / mL). A negative result must be combined with clinical  observations, patient history, and epidemiological information. If result is POSITIVE SARS-CoV-2 target nucleic acids are DETECTED. The SARS-CoV-2 RNA is generally detectable in upper and lower  respiratory specimens dur ing the acute phase of infection.  Positive  results are indicative of active infection with SARS-CoV-2.  Clinical  correlation with patient history and other diagnostic information is  necessary to determine patient infection status.  Positive results do  not rule out bacterial infection or co-infection with other viruses. If result is PRESUMPTIVE POSTIVE SARS-CoV-2 nucleic acids MAY BE PRESENT.   A presumptive positive result was obtained on the submitted specimen  and confirmed on repeat testing.  While 2019 novel coronavirus  (SARS-CoV-2) nucleic acids may be present in the submitted sample  additional confirmatory testing may be necessary for epidemiological  and / or clinical management purposes  to differentiate between  SARS-CoV-2 and other Sarbecovirus currently known to infect humans.  If clinically indicated additional testing with an alternate test  methodology [phone]) is advised. The SARS-CoV-2  RNA is generally  detectable in upper and lower respiratory sp ecimens during the acute  phase of infection. The expected result is Negative. Fact Sheet for Patients:  StrictlyIdeas.no Fact Sheet for Healthcare Providers: BankingDealers.co.za This test is not yet approved or cleared by the [place] FDA and has been authorized for detection and/or diagnosis of SARS-CoV-2 by FDA under an Emergency Use Authorization (EUA).  This EUA will remain in effect (meaning this test can be used) for the duration of the COVID-19 declaration under Section 564(b)(1) of the Act, 21 U.S.C. section 360bbb-3(b)(1),  unless the authorization is terminated or revoked sooner. Performed at [facility], [address] [place], [place] [address]     Ct Head Wo Contrast  Result Date: 04/15/2019 CLINICAL DATA:  Loss of consciousness. EXAM: CT HEAD WITHOUT CONTRAST TECHNIQUE: Contiguous axial images were obtained from the base of the skull through the vertex without intravenous contrast. COMPARISON:  12/17/2016 FINDINGS: Brain: There is no evidence for acute hemorrhage, hydrocephalus, mass lesion, or abnormal extra-axial fluid collection. No definite CT evidence for acute infarction. Diffuse loss of parenchymal volume is consistent with atrophy. Patchy low attenuation in the deep hemispheric and periventricular white matter is nonspecific, but likely reflects chronic microvascular ischemic demyelination. Vascular: No hyperdense vessel or unexpected calcification. Skull: No evidence for fracture. No worrisome lytic or sclerotic lesion. Sinuses/Orbits: The visualized paranasal sinuses and mastoid air cells are clear. Visualized portions of the globes and intraorbital fat are unremarkable. Other: None. IMPRESSION: 1. Stable.  No acute intracranial abnormality. 2. Atrophy with chronic small vessel white matter ischemic disease. Electronically Signed   By: Kennith Center M.D.   On: 04/15/2019 08:50   Dg Chest Portable 1 View  Result Date: 04/15/2019 CLINICAL DATA:  Syncope. Evaluate for infiltrate. Fall this morning EXAM: PORTABLE CHEST 1 VIEW COMPARISON:  08/17/2013 FINDINGS: Normal heart size and mediastinal contours. Atherosclerotic calcification. There is no edema, consolidation, effusion, or pneumothorax. Osteopenia without acute osseous finding IMPRESSION: No evidence of active disease. Electronically Signed   By: Marnee Spring M.D.   On: 04/15/2019 08:15   Dg Hip Unilat W Or Wo Pelvis 2-3 Views Right  Result Date: 04/15/2019 CLINICAL DATA:  Fall with inability to move the right hip EXAM: DG HIP (WITH OR WITHOUT PELVIS) 2-3V RIGHT COMPARISON:  None. FINDINGS: Acute intertrochanteric right femur fracture. There is widening at the proximal fracture. Located hips. No evidence of pelvic ring fracture. Osteopenia IMPRESSION: Acute intertrochanteric right femur fracture. Electronically Signed   By: Marnee Spring M.D.   On: 04/15/2019 07:59    ROS Blood pressure (!) 144/71, pulse 90, temperature 98.2 F (36.8 C), temperature source Oral, resp. rate 17, height  (1.626 m), weight 56.7 kg, SpO2 99 %. Physical Exam  RLE: Skin intact, shortened and externally rotated. TTP about the hip. NTTP about the knee and ankle. ROM and stability not tested secondary to known injury. SILT SPN/DPN/T/S/S nerves per patients baseline neuropathy, fires TA/EHL/GSC. Well perfused foot.   Assessment/Plan: Displaced R intertrochanteric fracture -Pt and her son were counseled on diagnosis and potential treatment options. Notably that a hip fx is a signal of overall poor health and it comes with a risk of mortality and morbidity with or without surgical intervention. We discussed that surgical intervention is aimed at avoiding the complications of bedrest such as VTE, bed sores, and pneumonia. However, this comes with the risk of surgery to include but not limited to  bleeding, infection, iatrogenic fracture/cartilage injury/tendon damage, damage to neurovascular structures,continued pain, nonunion, malunion, hardware failure or cut out and potential for need for revision surgery. As well as the medical complications to surgery to include, but not limited to VTE, cardiovascular events, pulmonary, renal or GI complications. Further, we discussed the expected post operative course and potential for a need in an inpatient rehab facility post operatively.  -The patient and her son voiced their understanding and would like to proceed with operative intervention.  -Plan for CMN today. Plan for 6 weeks of post operative anticoagulation.   Osvaldo Human Corydon Schweiss 04/15/2019,  11:48 AM

## 2019-04-15 NOTE — ED Notes (Signed)
Called lab to run troponin- stated they would run it now

## 2019-04-15 NOTE — ED Notes (Signed)
Pt transported to CT ?

## 2019-04-15 NOTE — Op Note (Signed)
PRE-OPERATIVE DIAGNOSIS:  Right pertrochanteric femur fracture  POST-OPERATIVE DIAGNOSIS:  Same  PROCEDURE:  Right intertrochanteric femur fracture closed reduction and short cephalomedullary nail  SURGEON:  Nida Boatman, M.D.         ASSISTANT: none  ANESTHESIA: spinal  ESTIMATED BLOOD LOSS: 200 mL  FLUIDS REPLACED: See anesthesia record  TOURNIQUET TIME: N/a   DRAINS: n/a  IMPLANTS UTILIZED: Sythes 49m 130 deg TFNA x 1776m Synthes cephalomedullary screw x10067m5.0mm60mstal locking screw x1.  INDICATIONS FOR SURGERY:Amy Reid is a 82 y88. year old female who has sustained a right intertrochanteric femur fracture After discussion of the risks and benefits of surgical intervention, the patient expressed understanding of the risks benefits and agree with plans for right femur cephalomedullary nail placement. Her son had signed the surgical consent as she had previously received pain medications.   PROCEDURE IN DETAIL: The patient was met in the pre-operative holding area and we re-discussed the risks/benefits/alternatives to surgery. We verified the consent and the surgical site was marked. The patient was brought back to the operating room and spinal anesthesia was provided. The patient was then transferred to the fracture table. A surgical time out was performed with all in the room in agreement.   The patients extremity was manipulated under fluoroscopy to show we could get adequate imaging and that near anatomic fracture reduction was possible. The patient's operative extremity was prepped and draped in standard fashion. A surgical time out was performed with all in the room in agreement.   A 5 cm incision was made proximal to the tip of the trochanter and dissection was made through the gluteal fascia until the greater trochanter was palpated. A starting wire was placed at the tip of the greater trochanter in both AP and lateral planes. An opening reamer was used  only in the proximal segment. he appropriate sized nail was then placed.   Next, a weber tenaculum was used as a bone hook to hook the medial neck and aid in reduction controlling the calcar as well as the apex anterior angulation of the fracture. The wire was then advanced on AP and lateral views until into subchondral bone with verification of adequate placement on multiplanar fluoroscopy. The screw length was measure. The drill was advanced to appropriate depth and the screw was placed. Once the screw was fully inserted the compression handle was used to further compress the fracture site.   The locking mechanism was then tightened down and released a quarter of a turn to allow for controlled collapse of the fracture.   Attention was then taken to the distal interlocking screw. The jig was used to place the appropriate length interlocking screw.   Final fluoroscopy showed adequate reduction and hardware placement.   The wounds were copiously irrigated and fascia closed with 0-vycril, and the skin closed with 2-0 monocryl and staples. The wounds were dressed with xeroform, sterile gauze, and tegaderm  The patient was transferred back to the hospital bed and to the PACU without difficulty.    AFTERCARE: The patient will be WBAT and see therapy for ambulation. DVT prophylaxis will be mobilization, SCDs, and six weeks of chemoprophylaxis. Chemoprophylaxis needs to be held for 6 hours post op secondary to the spinal anesthesia. The patient will need a two week follow up for wound check and staple removal.    KyleNida Boatman.

## 2019-04-15 NOTE — ED Notes (Signed)
Spoke with Dr Gaspar Bidding about pt NPO time and covid swab

## 2019-04-15 NOTE — Brief Op Note (Signed)
04/15/2019  1:57 PM  PATIENT:  Amy Reid  85 y.o. female  PRE-OPERATIVE DIAGNOSIS:  right intertrochanteric hip fracture  POST-OPERATIVE DIAGNOSIS:  right intertrochanteric hip fracture  PROCEDURE:  Procedure(s): INTRAMEDULLARY (IM) NAIL INTERTROCHANTRIC (Right)  SURGEON:  Surgeon(s) and Role:    * Rayen Palen, [name], MD - Primary  PHYSICIAN ASSISTANT:   ASSISTANTS: none   ANESTHESIA:   spinal  EBL:  200 mL   BLOOD ADMINISTERED:none  DRAINS: none   LOCAL MEDICATIONS USED:  NONE  SPECIMEN:  No Specimen  DISPOSITION OF SPECIMEN:  N/A  COUNTS:  YES  TOURNIQUET:  * No tourniquets in log *  DICTATION: .Note written in EPIC  PLAN OF CARE: Admit to inpatient   PATIENT DISPOSITION:  PACU - hemodynamically stable.   Delay start of Pharmacological VTE agent (>24hrs) due to surgical blood loss or risk of bleeding: no

## 2019-04-15 NOTE — Progress Notes (Signed)
FBS 110.

## 2019-04-15 NOTE — Anesthesia Preprocedure Evaluation (Addendum)
Anesthesia Evaluation  Patient identified by MRN, date of birth, ID band Patient awake    Reviewed: Allergy & Precautions, H&P , NPO status , Patient's Chart, lab work & pertinent test results  History of Anesthesia Complications Negative for: history of anesthetic complications  Airway Mallampati: III  TM Distance: <3 FB Neck ROM: limited    Dental  (+) Chipped, Poor Dentition   Pulmonary neg pulmonary ROS, neg shortness of breath,           Cardiovascular Exercise Tolerance: Good hypertension, (-) angina(-) DOE      Neuro/Psych TIACVA, Residual Symptoms negative psych ROS   GI/Hepatic negative GI ROS, Neg liver ROS,   Endo/Other  diabetes, Type 2  Renal/GU      Musculoskeletal   Abdominal   Peds  Hematology negative hematology ROS (+)   Anesthesia Other Findings Past Medical History: No date: Diabetes mellitus without complication (HCC) No date: Hypertension  History reviewed. No pertinent surgical history.  BMI    Body Mass Index: 21.46 kg/m      Reproductive/Obstetrics negative OB ROS                            Anesthesia Physical Anesthesia Plan  ASA: III  Anesthesia Plan: Spinal   Post-op Pain Management:    Induction:   PONV Risk Score and Plan:   Airway Management Planned: Natural Airway and Nasal Cannula  Additional Equipment:   Intra-op Plan:   Post-operative Plan:   Informed Consent: I have reviewed the patients History and Physical, chart, labs and discussed the procedure including the risks, benefits and alternatives for the proposed anesthesia with the patient or authorized representative who has indicated his/her understanding and acceptance.     Dental Advisory Given  Plan Discussed with: Anesthesiologist, CRNA and Surgeon  Anesthesia Plan Comments: (Patient reports no bleeding problems and no anticoagulant use.  Plan for spinal with backup  GA  Patient consented for risks of anesthesia including but not limited to:  - adverse reactions to medications - risk of bleeding, infection, nerve damage and headache - risk of failed spinal - damage to teeth, lips or other oral mucosa - sore throat or hoarseness - Damage to heart, brain, lungs or loss of life  Patient voiced understanding.)        Anesthesia Quick Evaluation

## 2019-04-15 NOTE — Anesthesia Procedure Notes (Signed)
Procedure Name: MAC Performed by: Orion Crook, CRNA Pre-anesthesia Checklist: Patient identified, Emergency Drugs available, Suction available, Patient being monitored and Timeout performed Patient Re-evaluated:Patient Re-evaluated prior to induction Oxygen Delivery Method: Simple face mask

## 2019-04-15 NOTE — H&P (Signed)
History and Physical    Amy Reid DQQ:[id number] DOB: [birth year] DOA: 04/15/2019  PCP: Sofie Hartigan, MD (Confirm with patient/family/NH records and if not entered, this has to be entered at [facility] point of entry) Patient coming from: home  I have personally briefly reviewed patient's old medical records in [place]  Chief Complaint: Syncope  HPI: Amy Reid is a 85 y.o. female with medical history significant of HTN, Rec TIA, DM II seen in ed for syncope and fall and rt hip fracture. Pt coming from home, pt got up last night to use the restroom about 4 am . Pt fell before sitting on the commode and in between commode and vanity,pt had a guest in her home who found her and called 911. Pt initially denies any dizziness or falls and later states that since she has been dizzy and for past three months or so her balance has been poor.She has not d/w PCP about these episodes and balance issue at home. Pt is blind in her right eye, from her previous stroke. Pt has had multiple TIA's.  Pt has seen cardiology at [facility].Son at bedside and states they were told her TIA was from high blood pressure.  Pt has no claustrophobic and has had MRI in past. Last hba1c is 7 per pt report.  ED Course:  Blood pressure (!) 162/71, pulse 97, temperature 98.2 F (36.8 C), temperature source Oral, resp. rate 17, height 5\' 4"  (1.626 m), weight 56.7 kg, SpO2 99 %. In ed pt found to  Have rt hip intertrochanteric fracture and orthopedic Dr.Nappo consulted with plan for RT hip repair today.  Review of Systems: As per HPI otherwise 10 point review of systems negative.    Past Medical History:  Diagnosis Date  . Diabetes mellitus without complication ([place])   . Hypertension     History reviewed. No pertinent surgical history.   reports that she has never smoked. She has never used smokeless tobacco. She reports that she does not drink alcohol or use drugs.  Allergies  Allergen Reactions   . Epinephrine Other (See Comments)    coma    Family History  Problem Relation Age of Onset  . Diabetes Mother   . Cancer Father      Prior to Admission medications   Medication Sig Start Date End Date Taking? Authorizing Provider  cetirizine (ZYRTEC) 10 MG tablet Take 10 mg by mouth daily as needed for allergies.    [provider]  latanoprost (XALATAN) 0.005 % ophthalmic solution Place 1 drop into both eyes at bedtime.    [provider]  lisinopril-hydrochlorothiazide (PRINZIDE,ZESTORETIC) 20-12.5 MG tablet Take 1 tablet by mouth at bedtime.  11/11/16   [provider]  lovastatin (MEVACOR) 20 MG tablet Take 20 mg by mouth at bedtime.  11/11/16   [provider]  metFORMIN (GLUCOPHAGE-XR) 500 MG 24 hr tablet Take 500 mg by mouth at bedtime.  10/13/16   [provider]    Physical Exam: Vitals:   04/15/19 0809 04/15/19 0830 04/15/19 0900 04/15/19 0930  BP: (!) 143/71 (!) 155/73 (!) 168/73 (!) 162/71  Pulse: 86 90 98 97  Resp:  15 17 17   Temp:      TempSrc:      SpO2: 100% 99% 100% 99%  Weight:      Height:        Constitutional: NAD, calm, comfortable Vitals:   04/15/19 0809 04/15/19 0830 04/15/19 0900 04/15/19 0930  BP: Marland Kitchen)  143/71 (!) 155/73 (!) 168/73 (!) 162/71  Pulse: 86 90 98 97  Resp:  15 17 17   Temp:      TempSrc:      SpO2: 100% 99% 100% 99%  Weight:      Height:       Eyes: PERRL,EOMI  lids and conjunctivae normal ENMT: Mucous membranes are moist. Posterior pharynx clear of any exudate or lesions.Normal dentition.  Neck: normal, supple, no masses, no thyromegaly Respiratory: clear to auscultation bilaterally, no wheezing, no crackles. Normal respiratory effort. No accessory muscle use.  Cardiovascular: Regular rate and rhythm, no murmurs / rubs / gallops. No extremity edema. 2+ pedal pulses. No carotid bruits.  Abdomen: no tenderness, no masses palpated. No hepatosplenomegaly. Bowel sounds positive.   Musculoskeletal: no clubbing / cyanosis. No joint deformity upper and lower extremities. Good ROM, no contractures. Normal muscle tone.  Skin: no rashes, lesions, ulcers. No induration Neurologic: CN 2-12 grossly intact. Sensation intact, DTR normal. Strength 5/5 in all 4.  Psychiatric: Normal judgment and insight. Alert and oriented x 3. Normal mood.   Labs on Admission: I have personally reviewed following labs and imaging studies  CBC: Recent Labs  Lab 04/15/19 0638  WBC 10.0  NEUTROABS 6.6  HGB 13.1  HCT 37.6  MCV 88.7  PLT 173   Basic Metabolic Panel: Recent Labs  Lab 04/15/19 0638  NA 138  K 3.5  CL 104  CO2 24  GLUCOSE 164*  BUN 39*  CREATININE 1.39*  CALCIUM 9.4   GFR: Estimated Creatinine Clearance: 24.6 mL/min (A) (by C-G formula based on SCr of 1.39 mg/dL (H)). Liver Function Tests: Recent Labs  Lab 04/15/19 0638  AST 24  ALT 18  ALKPHOS 42  BILITOT 0.7  PROT 6.8  ALBUMIN 3.9   No results for input(s): LIPASE, AMYLASE in the last 168 hours. No results for input(s): AMMONIA in the last 168 hours. Coagulation Profile: No results for input(s): INR, PROTIME in the last 168 hours. Cardiac Enzymes: No results for input(s): CKTOTAL, CKMB, CKMBINDEX, TROPONINI in the last 168 hours. BNP (last 3 results) No results for input(s): PROBNP in the last 8760 hours. HbA1C: No results for input(s): HGBA1C in the last 72 hours. CBG: No results for input(s): GLUCAP in the last 168 hours. Lipid Profile: No results for input(s): CHOL, HDL, LDLCALC, TRIG, CHOLHDL, LDLDIRECT in the last 72 hours. Thyroid Function Tests: No results for input(s): TSH, T4TOTAL, FREET4, T3FREE, THYROIDAB in the last 72 hours. Anemia Panel: No results for input(s): VITAMINB12, FOLATE, FERRITIN, TIBC, IRON, RETICCTPCT in the last 72 hours. Urine analysis:    Component Value Date/Time   COLORURINE Yellow 08/17/2013 0649   APPEARANCEUR Hazy 08/17/2013 0649   LABSPEC 1.016 08/17/2013  0649   PHURINE 5.0 08/17/2013 0649   GLUCOSEU Negative 08/17/2013 0649   HGBUR 1+ 08/17/2013 0649   BILIRUBINUR Negative 08/17/2013 0649   KETONESUR Trace 08/17/2013 0649   PROTEINUR Negative 08/17/2013 0649   NITRITE Negative 08/17/2013 0649   LEUKOCYTESUR Negative 08/17/2013 0649    Radiological Exams on Admission: Ct Head Wo Contrast  Result Date: 04/15/2019 CLINICAL DATA:  Loss of consciousness. EXAM: CT HEAD WITHOUT CONTRAST TECHNIQUE: Contiguous axial images were obtained from the base of the skull through the vertex without intravenous contrast. COMPARISON:  12/17/2016 FINDINGS: Brain: There is no evidence for acute hemorrhage, hydrocephalus, mass lesion, or abnormal extra-axial fluid collection. No definite CT evidence for acute infarction. Diffuse loss of parenchymal volume is consistent with atrophy. Patchy  low attenuation in the deep hemispheric and periventricular white matter is nonspecific, but likely reflects chronic microvascular ischemic demyelination. Vascular: No hyperdense vessel or unexpected calcification. Skull: No evidence for fracture. No worrisome lytic or sclerotic lesion. Sinuses/Orbits: The visualized paranasal sinuses and mastoid air cells are clear. Visualized portions of the globes and intraorbital fat are unremarkable. Other: None. IMPRESSION: 1. Stable.  No acute intracranial abnormality. 2. Atrophy with chronic small vessel white matter ischemic disease. Electronically Signed   By: Kennith Center M.D.   On: 04/15/2019 08:50   Dg Chest Portable 1 View  Result Date: 04/15/2019 CLINICAL DATA:  Syncope. Evaluate for infiltrate. Fall this morning EXAM: PORTABLE CHEST 1 VIEW COMPARISON:  08/17/2013 FINDINGS: Normal heart size and mediastinal contours. Atherosclerotic calcification. There is no edema, consolidation, effusion, or pneumothorax. Osteopenia without acute osseous finding IMPRESSION: No evidence of active disease. Electronically Signed   By: Marnee Spring  M.D.   On: 04/15/2019 08:15   Dg Hip Unilat W Or Wo Pelvis 2-3 Views Right  Result Date: 04/15/2019 CLINICAL DATA:  Fall with inability to move the right hip EXAM: DG HIP (WITH OR WITHOUT PELVIS) 2-3V RIGHT COMPARISON:  None. FINDINGS: Acute intertrochanteric right femur fracture. There is widening at the proximal fracture. Located hips. No evidence of pelvic ring fracture. Osteopenia IMPRESSION: Acute intertrochanteric right femur fracture. Electronically Signed   By: Marnee Spring M.D.   On: 04/15/2019 07:59     EKG: Independently reviewed.Sinus rhythm 86.   Assessment/Plan Principal Problem:   Syncope and collapse Active Problems:   Hip fracture requiring operative repair, right, closed, initial encounter (HCC)   Hypertension   Diabetes mellitus without complication (HCC)  Syncope / Collapse: We will admit pt inpatient status to med -surg with telemetry monitoring for evaluation of syncope and collapse. - will obtain Echo -Recent EKG shows NSR rate of 86 -Telemetry monitoring shows no arrthymias -Follow with cardiology on outpatient basis  -Vasovagal -check Orthostatic Vital Signs once pt is stable post op.   Neurologic (Transient Ischemic Attack (TIA), Seizure, [place] ([place]) Arteritis, Intermittent Pressure Hydrocephalus, Subclavian Steal Syndrome, Vertebrobasilar insufficiency  -Head CT negative ischemic event -will order MRI brain without contrast   Right intertrochanteric fracture: Orthopedic consult. Pt scheduled for surgery to day , DVT prophylaxis post op. Pain control PRN.  HTN: PRN hydralazine. tailor home BP regimen post op to prevent any orthostatic changes .  DM II: Hba1c./ SSI/ Accuchecks. Carb consistent cardiac diet once pt is post op and alert and awake.  Home regimen with metformin held as pt's creatinine has increased .  AKI: Attribute to dehydration we will hold pt's ACEi and metformin. Cont gentle hydration with LR at 75 x 8 hours.    DVT prophylaxis: Heparin from tomorrow.  Code Status: Full code  Family Communication: Son at bedside.  Disposition Plan: D/C to str Consults called: Orthopedic Dr.Nappo Admission status: inpatient to med-surg unti with telemetry.  Gertha Calkin MD [facility] If 7PM-7AM, please contact night-coverage [email or web address] Password [facility]  04/15/2019, 9:49 AM

## 2019-04-15 NOTE — ED Notes (Signed)
Admitting Dr at bedside

## 2019-04-15 NOTE — ED Notes (Signed)
Dr. Archie Balboa notified of syncope then fall.

## 2019-04-15 NOTE — ED Provider Notes (Signed)
[facility] Emergency Department Provider Note    First MD Initiated Contact with Patient 04/15/19 [phone]     (approximate)  I have reviewed the triage vital signs and the nursing notes.   HISTORY  Chief Complaint Fall and Loss of Consciousness    HPI Amy Reid is a 85 y.o. female with the below listed past medical history presents the ER for evaluation of right hip pain that occurred after fall today.  Denies any other associated pain.  States that she feels like she did passed out.  Slightly amnestic to the fall.  Her only complaint right now is the right hip pain.  States that she has felt generalized weakness for the past 2 days.  Son at bedside states that she may have slipped on a wet bathroom floor but was uncertain.  She does have a history of TIA and takes baby aspirin.    Past Medical History:  Diagnosis Date  . Diabetes mellitus without complication (HCC)   . Hypertension    No family history on file.  There are no active problems to display for this patient.     Prior to Admission medications   Medication Sig Start Date End Date Taking? Authorizing Provider  cetirizine (ZYRTEC) 10 MG tablet Take 10 mg by mouth daily as needed for allergies.    [provider]  latanoprost (XALATAN) 0.005 % ophthalmic solution Place 1 drop into both eyes at bedtime.    [provider]  lisinopril-hydrochlorothiazide (PRINZIDE,ZESTORETIC) 20-12.5 MG tablet Take 1 tablet by mouth at bedtime.  11/11/16   [provider]  lovastatin (MEVACOR) 20 MG tablet Take 20 mg by mouth at bedtime.  11/11/16   [provider]  metFORMIN (GLUCOPHAGE-XR) 500 MG 24 hr tablet Take 500 mg by mouth at bedtime.  10/13/16   [provider]    Allergies Epinephrine    Social History Social History   Tobacco Use  . Smoking status: Not on file  Substance Use Topics  . Alcohol use: Not on file  . Drug use: Not on file     Review of Systems Patient denies headaches, rhinorrhea, blurry vision, numbness, shortness of breath, chest pain, edema, cough, abdominal pain, nausea, vomiting, diarrhea, dysuria, fevers, rashes or hallucinations unless otherwise stated above in HPI. ____________________________________________   PHYSICAL EXAM:  VITAL SIGNS: Vitals:   04/15/19 0808 04/15/19 0809  BP:  (!) 143/71  Pulse:  86  Resp: 15   Temp:    SpO2:  100%    Constitutional: Alert and oriented.  Eyes: Conjunctivae are normal.  Head: Atraumatic. Nose: No congestion/rhinnorhea. Mouth/Throat: Mucous membranes are moist.   Neck: No stridor. Painless ROM.  Cardiovascular: Normal rate, regular rhythm. Grossly normal heart sounds.  Good peripheral circulation. Respiratory: Normal respiratory effort.  No retractions. Lungs CTAB. Gastrointestinal: Soft and nontender. No distention. No abdominal bruits. No CVA tenderness. Genitourinary:  Musculoskeletal: right leg shortened with ttp.  No joint effusions. Neurologic:  Normal speech and language. No gross focal neurologic deficits are appreciated. No facial droop Skin:  Skin is warm, dry and intact. No rash noted. Psychiatric: Mood and affect are normal. Speech and behavior are normal.  ____________________________________________   LABS (all labs ordered are listed, but only abnormal results are displayed)  Results for orders placed or performed during the hospital encounter of 04/15/19 (from the past 24 hour(s))  CBC with Differential     Status: None   Collection Time: 04/15/19  6:38  AM  Result Value Ref Range   WBC 10.0 4.0 - 10.5 K/uL   RBC 4.24 3.87 - 5.11 MIL/uL   Hemoglobin 13.1 12.0 - 15.0 g/dL   HCT 96.037.6 45.436.0 - 09.846.0 %   MCV 88.7 80.0 - 100.0 fL   MCH 30.9 26.0 - 34.0 pg   MCHC 34.8 30.0 - 36.0 g/dL   RDW 11.915.0 14.711.5 - 82.915.5 %   Platelets 173 150 - 400 K/uL   nRBC 0.0 0.0 - 0.2 %   Neutrophils Relative % 65 %   Neutro Abs 6.6 1.7 - 7.7 K/uL    Lymphocytes Relative 23 %   Lymphs Abs 2.3 0.7 - 4.0 K/uL   Monocytes Relative 8 %   Monocytes Absolute 0.8 0.1 - 1.0 K/uL   Eosinophils Relative 3 %   Eosinophils Absolute 0.3 0.0 - 0.5 K/uL   Basophils Relative 0 %   Basophils Absolute 0.0 0.0 - 0.1 K/uL   Immature Granulocytes 1 %   Abs Immature Granulocytes 0.05 0.00 - 0.07 K/uL  Basic metabolic panel     Status: Abnormal   Collection Time: 04/15/19  6:38 AM  Result Value Ref Range   Sodium 138 135 - 145 mmol/L   Potassium 3.5 3.5 - 5.1 mmol/L   Chloride 104 98 - 111 mmol/L   CO2 24 22 - 32 mmol/L   Glucose, Bld 164 (H) 70 - 99 mg/dL   BUN 39 (H) 8 - 23 mg/dL   Creatinine, Ser 5.621.39 (H) 0.44 - 1.00 mg/dL   Calcium 9.4 8.9 - 13.010.3 mg/dL   GFR calc non Af Amer 34 (L) >60 mL/min   GFR calc Af Amer 39 (L) >60 mL/min   Anion gap 10 5 - 15  Hepatic function panel     Status: None   Collection Time: 04/15/19  6:38 AM  Result Value Ref Range   Total Protein 6.8 6.5 - 8.1 g/dL   Albumin 3.9 3.5 - 5.0 g/dL   AST 24 15 - 41 U/L   ALT 18 0 - 44 U/L   Alkaline Phosphatase 42 38 - 126 U/L   Total Bilirubin 0.7 0.3 - 1.2 mg/dL   Bilirubin, Direct <8.6<0.1 0.0 - 0.2 mg/dL   Indirect Bilirubin NOT CALCULATED 0.3 - 0.9 mg/dL   ____________________________________________  EKG My review and personal interpretation at Time: 8:02   Indication: fall  Rate: 85  Rhythm: sinus Axis: normal  Other: normal intervals, no stemi ____________________________________________  RADIOLOGY  I personally reviewed all radiographic images ordered to evaluate for the above acute complaints and reviewed radiology reports and findings.  These findings were personally discussed with the patient.  Please see medical record for radiology report.  ____________________________________________   PROCEDURES  Procedure(s) performed:  Procedures    Critical Care performed: no ____________________________________________   INITIAL IMPRESSION / ASSESSMENT AND  PLAN / ED COURSE  Pertinent labs & imaging results that were available during my care of the patient were reviewed by me and considered in my medical decision making (see chart for details).   DDX: fracture, dislocation, contusion, hematoma, dysrhythmia, anemia, dehydration, seizure  Milana ObeyFrances R Deshaies is a 85 y.o. who presents to the ED with symptoms as described above.  Patient has evidence of right infiltrate fracture.  Little unclear as to whether this was a mechanical or syncopal event.  Patient will require admission to hospital for syncope work-up.  CT imaging of the head is reassuring.  Blood was reassuring.  Will discuss  with hospitalist for admission.  Discussed case with Dr. Gaspar Bidding of orthopedics.  Have discussed with the patient and available family all diagnostics and treatments performed thus far and all questions were answered to the best of my ability. The patient demonstrates understanding and agreement with plan.       The patient was evaluated in Emergency Department today for the symptoms described in the history of present illness. He/she was evaluated in the context of the global COVID-19 pandemic, which necessitated consideration that the patient might be at risk for infection with the SARS-CoV-2 virus that causes COVID-19. Institutional protocols and algorithms that pertain to the evaluation of patients at risk for COVID-19 are in a state of rapid change based on information released by regulatory bodies including the CDC and federal and state organizations. These policies and algorithms were followed during the patient's care in the ED.  As part of my medical decision making, I reviewed the following data within the [place] notes reviewed and incorporated, Labs reviewed, notes from prior ED visits and [place] Controlled Substance Database   ____________________________________________   FINAL CLINICAL IMPRESSION(S) / ED DIAGNOSES  Final diagnoses:   Syncope and collapse  Intertrochanteric fracture of right hip, closed, initial encounter ([place])      NEW MEDICATIONS STARTED DURING THIS VISIT:  New Prescriptions   No medications on file     Note:  This document was prepared using Dragon voice recognition software and may include unintentional dictation errors.    Merlyn Lot, MD 04/15/19 [phone]

## 2019-04-16 LAB — BASIC METABOLIC PANEL
Anion gap: 9 (ref 5–15)
BUN: 32 mg/dL — ABNORMAL HIGH (ref 8–23)
CO2: 24 mmol/L (ref 22–32)
Calcium: 8.2 mg/dL — ABNORMAL LOW (ref 8.9–10.3)
Chloride: 102 mmol/L (ref 98–111)
Creatinine, Ser: 1.19 mg/dL — ABNORMAL HIGH (ref 0.44–1.00)
GFR calc Af Amer: 48 mL/min — ABNORMAL LOW (ref >60)
GFR calc non Af Amer: 41 mL/min — ABNORMAL LOW (ref >60)
Glucose, Bld: 164 mg/dL — ABNORMAL HIGH (ref 70–99)
Potassium: 4 mmol/L (ref 3.5–5.1)
Sodium: 135 mmol/L (ref 135–145)

## 2019-04-16 LAB — GLUCOSE, CAPILLARY
Glucose-Capillary: 141 mg/dL — ABNORMAL HIGH (ref 70–99)
Glucose-Capillary: 121 mg/dL — ABNORMAL HIGH (ref 70–99)
Glucose-Capillary: 156 mg/dL — ABNORMAL HIGH (ref 70–99)

## 2019-04-16 LAB — CBC
HCT: 22.2 % — ABNORMAL LOW (ref 36.0–46.0)
Hemoglobin: 7.5 g/dL — ABNORMAL LOW (ref 12.0–15.0)
MCH: 31.3 pg (ref 26.0–34.0)
MCHC: 33.8 g/dL (ref 30.0–36.0)
MCV: 92.5 fL (ref 80.0–100.0)
Platelets: 123 10*3/uL — ABNORMAL LOW (ref 150–400)
RBC: 2.4 MIL/uL — ABNORMAL LOW (ref 3.87–5.11)
RDW: 15.2 % (ref 11.5–15.5)
WBC: 7.2 10*3/uL (ref 4.0–10.5)
nRBC: 0 % (ref 0.0–0.2)

## 2019-04-16 MED ORDER — VITAMIN B-12 1000 MCG PO TABS
1000.0000 ug | ORAL_TABLET | Freq: Every day | ORAL | Status: DC
  Administered 2019-04-16 – 2019-04-21 (×6): 1000 ug via ORAL
  Filled 2019-04-16 (×6): qty 1

## 2019-04-16 MED ORDER — PRAVASTATIN SODIUM 20 MG PO TABS
20.0000 mg | ORAL_TABLET | Freq: Every day | ORAL | Status: DC
  Administered 2019-04-16 – 2019-04-20 (×5): 20 mg via ORAL
  Filled 2019-04-16 (×5): qty 1

## 2019-04-16 MED ORDER — PRAVASTATIN SODIUM 20 MG PO TABS: 20.0000 mg | ORAL_TABLET | Freq: Every day | ORAL | Status: DC

## 2019-04-16 MED ORDER — SENNOSIDES-DOCUSATE SODIUM 8.6-50 MG PO TABS
1.0000 | ORAL_TABLET | Freq: Two times a day (BID) | ORAL | Status: DC
  Administered 2019-04-16 – 2019-04-21 (×11): 1 via ORAL
  Filled 2019-04-16 (×11): qty 1

## 2019-04-16 MED ORDER — PANTOPRAZOLE SODIUM 40 MG PO TBEC
40.0000 mg | DELAYED_RELEASE_TABLET | Freq: Two times a day (BID) | ORAL | Status: DC
  Administered 2019-04-16 – 2019-04-21 (×11): 40 mg via ORAL
  Filled 2019-04-16 (×11): qty 1

## 2019-04-16 MED ORDER — LISINOPRIL 20 MG PO TABS
20.0000 mg | ORAL_TABLET | Freq: Every day | ORAL | Status: DC
  Administered 2019-04-16 – 2019-04-19 (×4): 20 mg via ORAL
  Filled 2019-04-16 (×5): qty 1

## 2019-04-16 MED ORDER — FLEET ENEMA 7-19 GM/118ML RE ENEM: 1.0000 | ENEMA | Freq: Every day | RECTAL | Status: DC | PRN

## 2019-04-16 MED ORDER — ASPIRIN EC 81 MG PO TBEC
81.0000 mg | DELAYED_RELEASE_TABLET | Freq: Every day | ORAL | Status: DC
  Administered 2019-04-16 – 2019-04-21 (×6): 81 mg via ORAL
  Filled 2019-04-16 (×6): qty 1

## 2019-04-16 MED ORDER — BISACODYL 10 MG RE SUPP: 10.0000 mg | Freq: Every day | RECTAL | Status: DC | PRN

## 2019-04-16 MED ORDER — MAGNESIUM HYDROXIDE 400 MG/5ML PO SUSP
30.0000 mL | Freq: Every day | ORAL | Status: DC | PRN
  Administered 2019-04-16: 09:00:00 30 mL via ORAL
  Filled 2019-04-16: qty 30

## 2019-04-16 MED ORDER — IBUPROFEN 400 MG PO TABS: 400.0000 mg | ORAL_TABLET | Freq: Two times a day (BID) | ORAL | Status: DC | PRN

## 2019-04-16 MED ORDER — MAGNESIUM OXIDE 400 (241.3 MG) MG PO TABS
400.0000 mg | ORAL_TABLET | Freq: Every day | ORAL | Status: DC
  Administered 2019-04-16 – 2019-04-21 (×6): 400 mg via ORAL
  Filled 2019-04-16 (×6): qty 1

## 2019-04-16 MED ORDER — PROSIGHT PO TABS
1.0000 | ORAL_TABLET | Freq: Every day | ORAL | Status: DC
  Filled 2019-04-16: qty 1

## 2019-04-16 MED ORDER — ENOXAPARIN SODIUM 30 MG/0.3ML ~~LOC~~ SOLN
30.0000 mg | SUBCUTANEOUS | Status: DC
  Administered 2019-04-16 – 2019-04-17 (×2): 30 mg via SUBCUTANEOUS
  Filled 2019-04-16 (×2): qty 0.3

## 2019-04-16 MED ORDER — METOPROLOL TARTRATE 25 MG PO TABS
12.5000 mg | ORAL_TABLET | Freq: Two times a day (BID) | ORAL | Status: DC
  Administered 2019-04-16 – 2019-04-17 (×4): 12.5 mg via ORAL
  Filled 2019-04-16 (×5): qty 1

## 2019-04-16 MED ORDER — HYDROCHLOROTHIAZIDE 12.5 MG PO CAPS
12.5000 mg | ORAL_CAPSULE | Freq: Every day | ORAL | Status: DC
  Administered 2019-04-16 – 2019-04-19 (×4): 12.5 mg via ORAL
  Filled 2019-04-16 (×5): qty 1

## 2019-04-16 MED ORDER — OCUVITE-LUTEIN PO CAPS
1.0000 | ORAL_CAPSULE | Freq: Every day | ORAL | Status: DC
  Administered 2019-04-16 – 2019-04-20 (×5): 1 via ORAL
  Filled 2019-04-16 (×6): qty 1

## 2019-04-16 NOTE — Progress Notes (Signed)
PROGRESS NOTE    Amy Reid  WGN:[id number] DOB: [birth year] DOA: 04/15/2019 PCP: Amy Goodell, MD      Brief Narrative:  Amy Reid is a 85 y.o. F with HTN, DM, recent TIA who presented with syncope, fall and right hip pain/inability to walk.  Has had new dizziness and imbalance for last few months.  Morning of admission, patient was going to use the restroom, fell between the commode and wall.  Guest in home found her and called 9-1-1.    In the ER, radiograph showed a RIGHT hip fracture.  Orthopedics were consulted for repair.       Assessment & Plan:  Acute RIGHT hip fracture S/p intramedullary nail 11/21 by Dr. Franco Collet    Post-operative acute blood loss anemia Hgb 13 on admisison, today down to 7.5 g/dL.  Patient asymptomatic this morning. -Transfusion threshold 7 g/dL, or symptoms  Syncope Admitting differential vasovagal episode, orthostasis, TIA, seizure, GCA, intermittent pressure hydrocephalus, subclavian steal, vertebrobasilar insufficiency, arrhythmia. Patient and son at the bedside are able to describe a fairly clear pattern of lightheadedness/head rash/vision dimming that occurs shortly after standing, resolves with time and standing still. She has no other neurological deficits to suggest vertebrobasilar insufficiency. Distal pulses are normal, ruling out subclavian steal. She has no vision symptoms or headache to suggest GCA. Unable to do orthostatics due to fracture.   -Obtain orthostatic vital signs when able to stand  Non-sustained SVT Very brief narrow complex tachyarrhythmia noted on tele. -Resume telemetry -Start metoprolol low dose  Diabetes with neuropathy Glucoses well contrlled -Continue SS corrections -Continue gabapentin -Hold Actos  Hypertension Cerebrovascular disease  BP controlled -Stop HCTZ -Hold lisinopril, resume when BP improves -Start metoprolol  -Resume aspirin, statin  CKD IIIa Cr stable relative to  baseline        MDM and disposition: The below labs and imaging reports were reviewed and summarized above.  Medication management as above.  The patient was admitted with hip fracture.  Her postoperative course has been complicated by anemia. In the meantime we are working up for orthostatic hypotension.       DVT prophylaxis: Aspirin and Lovenox Code Status: Full code Family Communication: Son at the bedside    Consultants:   Dr. Franco Collet  Procedures:   11/21 intramedullary nailing  Antimicrobials:       Subjective: No dyspnea, chest pain, palpitations, fatigue, dizziness, weakness. Has pain in the hip. No cough, vomiting.    Objective: Vitals:   04/15/19 2323 04/16/19 0020 04/16/19 0448 04/16/19 0752  BP:  (!) 114/53 (!) 127/50 (!) 102/47  Pulse: 97 96 94 82  Resp:  16 18   Temp:  98.9 F (37.2 C) 98.5 F (36.9 C) 98.8 F (37.1 C)  TempSrc:  Oral Oral Oral  SpO2:  98% 98% 99%  Weight:      Height:        Intake/Output Summary (Last 24 hours) at 04/16/2019 1253 Last data filed at 04/16/2019 0900 Gross per 24 hour  Intake 1720 ml  Output 1450 ml  Net 270 ml   Filed Weights   04/15/19 0635  Weight: 56.7 kg    Examination: General appearance: Thin elderly adult female, alert and in no acute distress.   HEENT: Anicteric, conjunctiva pink, lids and lashes normal. No nasal deformity, discharge, epistaxis.  Lips moist.   Skin: Warm and dry. No jaundice.  No suspicious rashes or lesions. Cardiac: RRR, nl S1-S2, no murmurs appreciated.  Capillary refill is  brisk.  JVP normal.  No LE edema.  Radial pulses 2+ and symmetric. Respiratory: Normal respiratory rate and rhythm. Diminished air entry, diminished respiratory effort. Abdomen: Abdomen soft. No TTP or guarding. No ascites, distension, hepatosplenomegaly.   MSK: No deformities or effusions. Diffuse loss of subcutaneous muscle mass and fat. Neuro: Awake and alert.  EOMI, moves upper extremities with  normal strength and coordination, lower extremity strength testing not tested due to fracture. No nystagmus. Upper arm coordination appear normal. Speech fluent.    Psych: Sensorium intact and responding to questions, attention normal. Affect normal  Judgment and insight appear normal.    Data Reviewed: I have personally reviewed following labs and imaging studies:  CBC: Recent Labs  Lab 04/15/19 0638 04/16/19 0545  WBC 10.0 7.2  NEUTROABS 6.6  --   HGB 13.1 7.5*  HCT 37.6 22.2*  MCV 88.7 92.5  PLT 173 858*   Basic Metabolic Panel: Recent Labs  Lab 04/15/19 0638 04/16/19 0545  NA 138 135  K 3.5 4.0  CL 104 102  CO2 24 24  GLUCOSE 164* 164*  BUN 39* 32*  CREATININE 1.39* 1.19*  CALCIUM 9.4 8.2*   GFR: Estimated Creatinine Clearance: 28.8 mL/min (A) (by C-G formula based on SCr of 1.19 mg/dL (H)). Liver Function Tests: Recent Labs  Lab 04/15/19 0638  AST 24  ALT 18  ALKPHOS 42  BILITOT 0.7  PROT 6.8  ALBUMIN 3.9   No results for input(s): LIPASE, AMYLASE in the last 168 hours. No results for input(s): AMMONIA in the last 168 hours. Coagulation Profile: No results for input(s): INR, PROTIME in the last 168 hours. Cardiac Enzymes: No results for input(s): CKTOTAL, CKMB, CKMBINDEX, TROPONINI in the last 168 hours. BNP (last 3 results) No results for input(s): PROBNP in the last 8760 hours. HbA1C: No results for input(s): HGBA1C in the last 72 hours. CBG: Recent Labs  Lab 04/15/19 1714 04/16/19 0754 04/16/19 1159  GLUCAP 116* 141* 121*   Lipid Profile: No results for input(s): CHOL, HDL, LDLCALC, TRIG, CHOLHDL, LDLDIRECT in the last 72 hours. Thyroid Function Tests: No results for input(s): TSH, T4TOTAL, FREET4, T3FREE, THYROIDAB in the last 72 hours. Anemia Panel: No results for input(s): VITAMINB12, FOLATE, FERRITIN, TIBC, IRON, RETICCTPCT in the last 72 hours. Urine analysis:    Component Value Date/Time   COLORURINE Yellow 08/17/2013 0649    APPEARANCEUR Hazy 08/17/2013 0649   LABSPEC 1.016 08/17/2013 0649   PHURINE 5.0 08/17/2013 0649   GLUCOSEU Negative 08/17/2013 0649   HGBUR 1+ 08/17/2013 0649   BILIRUBINUR Negative 08/17/2013 0649   KETONESUR Trace 08/17/2013 0649   PROTEINUR Negative 08/17/2013 0649   NITRITE Negative 08/17/2013 0649   LEUKOCYTESUR Negative 08/17/2013 0649   Sepsis Labs: @LABRCNTIP (procalcitonin:4,lacticacidven:4)  ) Recent Results (from the past 240 hour(s))  SARS Coronavirus 2 by RT PCR (hospital order, performed in [facility] hospital lab) Nasopharyngeal Nasopharyngeal Swab     Status: None   Collection Time: 04/15/19  8:09 AM   Specimen: Nasopharyngeal Swab  Result Value Ref Range Status   SARS Coronavirus 2 NEGATIVE NEGATIVE Final    Comment: (NOTE) If result is NEGATIVE SARS-CoV-2 target nucleic acids are NOT DETECTED. The SARS-CoV-2 RNA is generally detectable in upper and lower  respiratory specimens during the acute phase of infection. The lowest  concentration of SARS-CoV-2 viral copies this assay can detect is 250  copies / mL. A negative result does not preclude SARS-CoV-2 infection  and should not be used as the  sole basis for treatment or other  patient management decisions.  A negative result may occur with  improper specimen collection / handling, submission of specimen other  than nasopharyngeal swab, presence of viral mutation(s) within the  areas targeted by this assay, and inadequate number of viral copies  (<250 copies / mL). A negative result must be combined with clinical  observations, patient history, and epidemiological information. If result is POSITIVE SARS-CoV-2 target nucleic acids are DETECTED. The SARS-CoV-2 RNA is generally detectable in upper and lower  respiratory specimens dur ing the acute phase of infection.  Positive  results are indicative of active infection with SARS-CoV-2.  Clinical  correlation with patient history and other diagnostic  information is  necessary to determine patient infection status.  Positive results do  not rule out bacterial infection or co-infection with other viruses. If result is PRESUMPTIVE POSTIVE SARS-CoV-2 nucleic acids MAY BE PRESENT.   A presumptive positive result was obtained on the submitted specimen  and confirmed on repeat testing.  While 2019 novel coronavirus  (SARS-CoV-2) nucleic acids may be present in the submitted sample  additional confirmatory testing may be necessary for epidemiological  and / or clinical management purposes  to differentiate between  SARS-CoV-2 and other Sarbecovirus currently known to infect humans.  If clinically indicated additional testing with an alternate test  methodology [phone]) is advised. The SARS-CoV-2 RNA is generally  detectable in upper and lower respiratory sp ecimens during the acute  phase of infection. The expected result is Negative. Fact Sheet for Patients:  BoilerBrush.com.cy Fact Sheet for Healthcare Providers: https://pope.com/ This test is not yet approved or cleared by the [place] FDA and has been authorized for detection and/or diagnosis of SARS-CoV-2 by FDA under an Emergency Use Authorization (EUA).  This EUA will remain in effect (meaning this test can be used) for the duration of the COVID-19 declaration under Section 564(b)(1) of the Act, 21 U.S.C. section 360bbb-3(b)(1), unless the authorization is terminated or revoked sooner. Performed at [facility], [address], [place], [place] [address]          Radiology Studies: Ct Head Wo Contrast  Result Date: 04/15/2019 CLINICAL DATA:  Loss of consciousness. EXAM: CT HEAD WITHOUT CONTRAST TECHNIQUE: Contiguous axial images were obtained from the base of the skull through the vertex without intravenous contrast. COMPARISON:  12/17/2016 FINDINGS: Brain: There is no evidence for acute hemorrhage,  hydrocephalus, mass lesion, or abnormal extra-axial fluid collection. No definite CT evidence for acute infarction. Diffuse loss of parenchymal volume is consistent with atrophy. Patchy low attenuation in the deep hemispheric and periventricular white matter is nonspecific, but likely reflects chronic microvascular ischemic demyelination. Vascular: No hyperdense vessel or unexpected calcification. Skull: No evidence for fracture. No worrisome lytic or sclerotic lesion. Sinuses/Orbits: The visualized paranasal sinuses and mastoid air cells are clear. Visualized portions of the globes and intraorbital fat are unremarkable. Other: None. IMPRESSION: 1. Stable.  No acute intracranial abnormality. 2. Atrophy with chronic small vessel white matter ischemic disease. Electronically Signed   By: Kennith Center M.D.   On: 04/15/2019 08:50   Dg Chest Portable 1 View  Result Date: 04/15/2019 CLINICAL DATA:  Syncope. Evaluate for infiltrate. Fall this morning EXAM: PORTABLE CHEST 1 VIEW COMPARISON:  08/17/2013 FINDINGS: Normal heart size and mediastinal contours. Atherosclerotic calcification. There is no edema, consolidation, effusion, or pneumothorax. Osteopenia without acute osseous finding IMPRESSION: No evidence of active disease. Electronically Signed   By: Kathrynn Ducking.D.  On: 04/15/2019 08:15   Dg Hip Operative Unilat W Or W/o Pelvis Right  Result Date: 04/15/2019 CLINICAL DATA:  85 year old female EXAM: OPERATIVE RIGHT HIP (WITH PELVIS IF PERFORMED)  VIEWS TECHNIQUE: Fluoroscopic spot image(s) were submitted for interpretation post-operatively. COMPARISON:  None. FINDINGS: Intraoperative saved images obtained during open reduction and internal fixation of the intertrochanteric right femoral neck fracture. An intramedullary nail with a single distal interlocking screw and a transfemoral neck lag screw have been placed. Alignment of the fracture fragments is improved compared to the preoperative radiographs.  No evidence of immediate hardware complication. IMPRESSION: In progress ORIF of intercondylar right femoral neck fracture without evidence of immediate complication. Electronically Signed   By: Malachy MoanHeath  McCullough M.D.   On: 04/15/2019 13:54   Dg Hip Unilat With Pelvis 2-3 Views Right  Result Date: 04/15/2019 CLINICAL DATA:  Status post ORIF of a right proximal femur fracture. EXAM: DG HIP (WITH OR WITHOUT PELVIS) 2-3V RIGHT COMPARISON:  Operative images obtained earlier today. FINDINGS: Short intramedullary rod supports a compression screw reducing the intertrochanteric fracture into near anatomic alignment. The orthopedic hardware appears well seated. Hip joint is normally spaced and aligned. IMPRESSION: 1. Well aligned proximal right femur intertrochanteric fracture following ORIF. Electronically Signed   By: Amie Portlandavid  Ormond M.D.   On: 04/15/2019 15:28   Dg Hip Unilat W Or Wo Pelvis 2-3 Views Right  Result Date: 04/15/2019 CLINICAL DATA:  Fall with inability to move the right hip EXAM: DG HIP (WITH OR WITHOUT PELVIS) 2-3V RIGHT COMPARISON:  None. FINDINGS: Acute intertrochanteric right femur fracture. There is widening at the proximal fracture. Located hips. No evidence of pelvic ring fracture. Osteopenia IMPRESSION: Acute intertrochanteric right femur fracture. Electronically Signed   By: Marnee SpringJonathon  Watts M.D.   On: 04/15/2019 07:59        Scheduled Meds: . acetaminophen  1,000 mg Oral Q8H  . aspirin EC  81 mg Oral Daily  . Chlorhexidine Gluconate Cloth  6 each Topical Daily  . enoxaparin (LOVENOX) injection  30 mg Subcutaneous Q24H  . gabapentin  100 mg Oral QHS  . hydrochlorothiazide  12.5 mg Oral Daily  . insulin aspart  0-9 Units Subcutaneous TID WC  . latanoprost  1 drop Both Eyes QHS  . lisinopril  20 mg Oral Daily  . magnesium oxide  400 mg Oral Daily  . metoprolol tartrate  12.5 mg Oral BID  . multivitamin-lutein  1 capsule Oral Daily  . pantoprazole  40 mg Oral BID  .  pravastatin  20 mg Oral q1800  . senna-docusate  1 tablet Oral BID  . sodium chloride flush  3 mL Intravenous Q12H  . vitamin B-12  1,000 mcg Oral Daily   Continuous Infusions: .  ceFAZolin (ANCEF) IV 1 g (04/16/19 0540)  . ondansetron (ZOFRAN) IV       LOS: 1 day    Time spent: 25 minutes    Alberteen Samhristopher P Karina Nofsinger, MD [facility] 04/16/2019, 12:53 PM     Please page though AMION or Epic secure chat:  For [employer] Holdings Corporation[employer]ion password, Higher education careers advisercontact charge nurse

## 2019-04-16 NOTE — Progress Notes (Signed)
Amy Reid is a 85 y.o. female patient.  1. Syncope and collapse   2. Intertrochanteric fracture of right hip, closed, initial encounter ([place])   3. Closed intertrochanteric fracture of hip, left, initial encounter ([place])   4. Hip fracture requiring operative repair [facility])    Past Medical History:  Diagnosis Date  . Diabetes mellitus without complication ([name])   . Hypertension    No past surgical history pertinent negatives on file. Scheduled Meds: . acetaminophen  1,000 mg Oral Q8H  . aspirin EC  81 mg Oral Daily  . Chlorhexidine Gluconate Cloth  6 each Topical Daily  . enoxaparin (LOVENOX) injection  30 mg Subcutaneous Q24H  . gabapentin  100 mg Oral QHS  . hydrochlorothiazide  12.5 mg Oral Daily  . insulin aspart  0-9 Units Subcutaneous TID WC  . latanoprost  1 drop Both Eyes QHS  . lisinopril  20 mg Oral Daily  . magnesium oxide  400 mg Oral Daily  . metoprolol tartrate  12.5 mg Oral BID  . multivitamin-lutein  1 capsule Oral Daily  . pantoprazole  40 mg Oral BID  . pravastatin  20 mg Oral q1800  . senna-docusate  1 tablet Oral BID  . sodium chloride flush  3 mL Intravenous Q12H  . vitamin B-12  1,000 mcg Oral Daily   Continuous Infusions: .  ceFAZolin (ANCEF) IV 1 g (04/16/19 0540)  . ondansetron (ZOFRAN) IV     PRN Meds:bisacodyl, HYDROmorphone (DILAUDID) injection, magnesium hydroxide, ondansetron (ZOFRAN) IV, oxyCODONE, oxyCODONE, sodium phosphate  Allergies  Allergen Reactions  . Epinephrine Other (See Comments)    coma   Principal Problem:   Syncope and collapse Active Problems:   Hip fracture requiring operative repair, right, closed, initial encounter ([name])   Hypertension   Diabetes mellitus without complication ([name])   Closed intertrochanteric fracture of hip, left, initial encounter ([name])  Blood pressure (!) 102/47, pulse 82, temperature 98.8 F (37.1 C), temperature source Oral, resp. rate 18, height 5\' 4"  (1.626 m), weight 56.7 kg, SpO2 99  %.  85 y/o F s/p R hip CMN 04/15/19  24: Pain is controlled. No SOB/light headedness/chest pain. Had difficulty with food yesterday, tolerating diet now  R Hip: Dressing c/d/i. [name]. Fires TA/EHL/GSC. Well perfused foot  CBC    Component Value Date/Time   WBC 7.2 04/16/2019 0545   RBC 2.40 (L) 04/16/2019 0545   HGB 7.5 (L) 04/16/2019 0545   HGB 12.3 08/18/2013 0422   HCT 22.2 (L) 04/16/2019 0545   HCT 37.1 08/18/2013 0422   PLT 123 (L) 04/16/2019 0545   PLT 115 (L) 08/18/2013 0422   MCV 92.5 04/16/2019 0545   MCV 93 08/18/2013 0422   MCH 31.3 04/16/2019 0545   MCHC 33.8 04/16/2019 0545   RDW 15.2 04/16/2019 0545   RDW 14.9 (H) 08/18/2013 0422   LYMPHSABS 2.3 04/15/2019 0638   LYMPHSABS 0.9 (L) 08/18/2013 0422   MONOABS 0.8 04/15/2019 0638   MONOABS 0.3 08/18/2013 0422   EOSABS 0.3 04/15/2019 0638   EOSABS 0.0 08/18/2013 0422   BASOSABS 0.0 04/15/2019 0638   BASOSABS 0.0 08/18/2013 0422    A/P: 85 y/o F s/p R hip CMN on 04/16/19 -Abx x 24 hr post op -PT for mobilization- WBAT, ROMAT -DVT prophy: Early mobilization, SCDs, and chemoprophylaxis of primary choice x 6 wks.  -Discussed the potential for need for blood transfusion.  -Dispo: Pending PT and mobilization  [name] [name] 04/16/2019

## 2019-04-16 NOTE — Progress Notes (Signed)
Pt with 5 beat of run of SVT Rufina Falco NP notified.

## 2019-04-16 NOTE — Progress Notes (Signed)
Anticoagulation Renal Dose Adjustment  Estimated Creatinine Clearance: 28.8 mL/min (A) (by C-G formula based on SCr of 1.19 mg/dL (H)).   Current orders for enoxaparin 30mg  SQ Q12H for VTE prophylaxis following orthopaedic surgery.  Will adjust to enoxaparin 30mg  SQ Q24H due for CrCl < 68ml/min  Rexene Edison, PharmD, BCPS Clinical Pharmacist 04/16/2019 8:56 AM

## 2019-04-16 NOTE — Anesthesia Postprocedure Evaluation (Signed)
Anesthesia Post Note  Patient: Amy Reid  Procedure(s) Performed: INTRAMEDULLARY (IM) NAIL INTERTROCHANTRIC (Right )  Patient location during evaluation: Nursing Unit Anesthesia Type: Spinal Level of consciousness: oriented and awake and alert Pain management: pain level controlled Vital Signs Assessment: post-procedure vital signs reviewed and stable Respiratory status: spontaneous breathing and respiratory function stable Cardiovascular status: blood pressure returned to baseline and stable Postop Assessment: no headache, no backache, no apparent nausea or vomiting and patient able to bend at knees Anesthetic complications: no     Last Vitals:  Vitals:   04/16/19 0448 04/16/19 0752  BP: (!) 127/50 (!) 102/47  Pulse: 94 82  Resp: 18   Temp: 36.9 C 37.1 C  SpO2: 98% 99%    Last Pain:  Vitals:   04/16/19 0949  TempSrc:   PainSc: 3                  Martha Clan

## 2019-04-16 NOTE — Progress Notes (Signed)
FBS 134

## 2019-04-17 ENCOUNTER — Encounter: Payer: Self-pay | Admitting: Orthopaedic Surgery

## 2019-04-17 DIAGNOSIS — S72142A Displaced intertrochanteric fracture of left femur, initial encounter for closed fracture: Secondary | ICD-10-CM

## 2019-04-17 LAB — GLUCOSE, CAPILLARY
Glucose-Capillary: 118 mg/dL — ABNORMAL HIGH (ref 70–99)
Glucose-Capillary: 133 mg/dL — ABNORMAL HIGH (ref 70–99)
Glucose-Capillary: 143 mg/dL — ABNORMAL HIGH (ref 70–99)

## 2019-04-17 LAB — CBC
HCT: 20.6 % — ABNORMAL LOW (ref 36.0–46.0)
Hemoglobin: 7.3 g/dL — ABNORMAL LOW (ref 12.0–15.0)
MCH: 30.9 pg (ref 26.0–34.0)
MCHC: 35.4 g/dL (ref 30.0–36.0)
MCV: 87.3 fL (ref 80.0–100.0)
Platelets: 126 10*3/uL — ABNORMAL LOW (ref 150–400)
RBC: 2.36 MIL/uL — ABNORMAL LOW (ref 3.87–5.11)
RDW: 15.2 % (ref 11.5–15.5)
WBC: 9 10*3/uL (ref 4.0–10.5)
nRBC: 0 % (ref 0.0–0.2)

## 2019-04-17 LAB — BASIC METABOLIC PANEL
Anion gap: 12 (ref 5–15)
BUN: 27 mg/dL — ABNORMAL HIGH (ref 8–23)
CO2: 22 mmol/L (ref 22–32)
Calcium: 8.6 mg/dL — ABNORMAL LOW (ref 8.9–10.3)
Chloride: 102 mmol/L (ref 98–111)
Creatinine, Ser: 1.12 mg/dL — ABNORMAL HIGH (ref 0.44–1.00)
GFR calc Af Amer: 51 mL/min — ABNORMAL LOW (ref >60)
GFR calc non Af Amer: 44 mL/min — ABNORMAL LOW (ref >60)
Glucose, Bld: 138 mg/dL — ABNORMAL HIGH (ref 70–99)
Potassium: 3.9 mmol/L (ref 3.5–5.1)
Sodium: 136 mmol/L (ref 135–145)

## 2019-04-17 NOTE — Evaluation (Signed)
Physical Therapy Evaluation Patient Details Name: Amy Reid MRN: [id number] DOB: 12/27/1929 Today's Date: 04/17/2019   History of Present Illness  From MD notes: Pt is an 85 y.o. female with medical history significant of HTN, Rec TIA, DM II seen in ED for syncope and fall and rt hip fracture.  Pt is s/p closed reduction and short cephalomedullary nail of a R intertrochanteric femur fracture.  MD assessment also includes: post op blood loss anemia, syncope, non-sustained SVT, DM with neuropathy, HTN, and CKD III.    Clinical Impression  Pt presented with deficits in strength, transfers, mobility, gait, balance, and activity tolerance.  Pt required extensive physical assistance with bed mobility and transfers and demonstrated min to mod posterior lean in sitting but more notably in standing where she required frequent min A to prevent LOB.  Pt was able to lift her RLE minimally from the floor in standing several times but was unable to lift her LLE from the floor or ambulate.  Pt presents with significant deficits in function compared to her baseline and would not be safe to return to her prior living situation at this time.  Pt will benefit from PT services in a SNF setting upon discharge to safely address above deficits for decreased caregiver assistance and eventual return to PLOF.      Follow Up Recommendations SNF    Equipment Recommendations  Rolling walker with 5" wheels;3in1 (PT);Other (comment)(TBD at next venue of care upon d/c to SNF)    Recommendations for Other Services       Precautions / Restrictions Precautions Precautions: Fall Precaution Comments: Blind in R eye Restrictions Weight Bearing Restrictions: Yes RLE Weight Bearing: Weight bearing as tolerated      Mobility  Bed Mobility Overal bed mobility: Needs Assistance Bed Mobility: Rolling;Sidelying to Sit;Sit to Sidelying Rolling: Mod assist Sidelying to sit: Max assist     Sit to sidelying: Max  assist General bed mobility comments: Log roll training to the L with pillow between the knees for hip support/pain control with pt near total assist  Transfers Overall transfer level: Needs assistance Equipment used: Rolling walker (2 wheeled) Transfers: Sit to/from Stand Sit to Stand: From elevated surface;Mod assist         General transfer comment: Mod verbal cues for sequencing and Mod A to stand; pt required frequent Min A to prevent posterior LOB in standing with heavy verbal and tactile cues for anterior weight shift  Ambulation/Gait             General Gait Details: Unable  Stairs            Wheelchair Mobility    Modified Rankin (Stroke Patients Only)       Balance Overall balance assessment: Needs assistance   Sitting balance-Leahy Scale: Fair   Postural control: Posterior lean Standing balance support: Bilateral upper extremity supported Standing balance-Leahy Scale: Poor Standing balance comment: Frequent min A to prevent posterior LOB in standing                             Pertinent Vitals/Pain Pain Assessment: 0-10 Pain Score: 3  Pain Location: R hip Pain Descriptors / Indicators: Aching;Sore Pain Intervention(s): Premedicated before session;Monitored during session    Home Living Family/patient expects to be discharged to:: Private residence Living Arrangements: Alone Available Help at Discharge: Family;Available PRN/intermittently Type of Home: House Home Access: Stairs to enter Entrance Stairs-Rails: None Entrance Stairs-Number of Steps:  1 Home Layout: One level Home Equipment: Cane - quad;Cane - single point;Grab bars - tub/shower Additional Comments: Family may be able to pay for caregiver assist once pt returns home    Prior Function Level of Independence: Independent         Comments: Ind amb community distances without an AD, 3 total falls in the last 6 months all from "passing out", Ind with ADLs, dances  1x/wk, works in the yard     Higher education careers adviser        Extremity/Trunk Assessment   Upper Extremity Assessment Upper Extremity Assessment: Generalized weakness    Lower Extremity Assessment Lower Extremity Assessment: Generalized weakness;RLE deficits/detail RLE Deficits / Details: R hip flex strength <3/5, limited by pain RLE: Unable to fully assess due to pain       Communication   Communication: HOH  Cognition Arousal/Alertness: Awake/alert Behavior During Therapy: WFL for tasks assessed/performed Overall Cognitive Status: Within Functional Limits for tasks assessed                                        General Comments      Exercises Total Joint Exercises Ankle Circles/Pumps: Strengthening;Both;10 reps Quad Sets: Strengthening;Both;10 reps Gluteal Sets: Strengthening;Both;10 reps Towel Squeeze: Strengthening;Both;5 reps Heel Slides: AAROM;Both;5 reps Hip ABduction/ADduction: AAROM;Both;10 reps Straight Leg Raises: AAROM;Both;10 reps Long Arc Quad: AROM;Both;10 reps;5 reps Knee Flexion: AROM;Both;10 reps;5 reps Marching in Standing: AROM;Right;5 reps Other Exercises: Anterior weight shifting in sitting and standing to prevent posterior LOB; log roll training Other Exercises: HEP education with pt and son for BLE APs, QS, and GS x 10 each every 1-2 hours   Assessment/Plan    PT Assessment Patient needs continued PT services  PT Problem List Decreased strength;Decreased activity tolerance;Decreased balance;Decreased mobility;Decreased knowledge of use of DME       PT Treatment Interventions DME instruction;Gait training;Stair training;Functional mobility training;Patient/family education;Therapeutic activities;Therapeutic exercise;Balance training;Neuromuscular re-education    PT Goals (Current goals can be found in the Care Plan section)  Acute Rehab PT Goals Patient Stated Goal: To get back to the way I was PT Goal Formulation: With  patient Time For Goal Achievement: 04/30/19 Potential to Achieve Goals: Good    Frequency BID   Barriers to discharge Inaccessible home environment;Decreased caregiver support      Co-evaluation               AM-PAC PT "6 Clicks" Mobility  Outcome Measure Help needed turning from your back to your side while in a flat bed without using bedrails?: A Lot Help needed moving from lying on your back to sitting on the side of a flat bed without using bedrails?: A Lot Help needed moving to and from a bed to a chair (including a wheelchair)?: Total Help needed standing up from a chair using your arms (e.g., wheelchair or bedside chair)?: A Lot Help needed to walk in hospital room?: Total Help needed climbing 3-5 steps with a railing? : Total 6 Click Score: 9    End of Session Equipment Utilized During Treatment: Gait belt Activity Tolerance: Patient limited by pain Patient left: in bed;with bed alarm set;with call bell/phone within reach;with family/visitor present Nurse Communication: Mobility status PT Visit Diagnosis: Unsteadiness on feet (R26.81);History of falling (Z91.81);Other abnormalities of gait and mobility (R26.89);Muscle weakness (generalized) (M62.81)    Time: 2423-5361 PT Time Calculation (min) (ACUTE ONLY): 41 min   Charges:  PT Evaluation $PT Eval Moderate Complexity: 1 Mod PT Treatments $Therapeutic Exercise: 8-22 mins $Therapeutic Activity: 8-22 mins        D. Scott Khaliq Turay PT, DPT 04/17/19, 1:56 PM

## 2019-04-17 NOTE — NC FL2 (Addendum)
[place] LEVEL OF CARE SCREENING TOOL     IDENTIFICATION  Patient Name: Amy Reid Birthdate: 1930/03/26 Sex: female Admission Date (Current Location): 04/15/2019  [place] and [place] Number:  Engineering geologist and Address:  [facility], [address] [place], [place] [address]      Provider Number: [id number]  Attending Physician Name and Address:  Max Sane, MD  Relative Name and Phone Number:  Katrina Stack SOn [phone]    Current Level of Care: SNF Recommended Level of Care: [place] Prior Approval Number:    Date Approved/Denied:   PASRR Number: [id number]  Discharge Plan: SNF    Current Diagnoses: Patient Active Problem List   Diagnosis Date Noted  . Syncope and collapse 04/15/2019  . Hip fracture requiring operative repair, right, closed, initial encounter ([place]) 04/15/2019  . Hypertension   . Diabetes mellitus without complication ([place])   . Intertrochanteric fracture of right hip, closed, initial encounter ([place])   . Closed intertrochanteric fracture of hip, left, initial encounter ([place])     Orientation RESPIRATION BLADDER Height & Weight     Self, Time, Situation, Place  Normal Continent Weight: 56.7 kg Height:  5\' 4"  (162.6 cm)  BEHAVIORAL SYMPTOMS/MOOD NEUROLOGICAL BOWEL NUTRITION STATUS      Continent    AMBULATORY STATUS COMMUNICATION OF NEEDS Skin   Extensive Assist Verbally                         Personal Care Assistance Level of Assistance  Dressing, Bathing Bathing Assistance: Limited assistance   Dressing Assistance: Limited assistance     Functional Limitations Info  Hearing   Hearing Info: Impaired      SPECIAL CARE FACTORS FREQUENCY  PT (By licensed PT)     PT Frequency: 5 times per week              Contractures Contractures Info: Not present    Additional Factors Info  Code Status, Allergies Code Status Info: full code Allergies Info:  Epinephrine           Current Medications (04/17/2019):  This is the current hospital active medication list Current Facility-Administered Medications  Medication Dose Route Frequency Provider Last Rate Last Dose  . acetaminophen (TYLENOL) tablet 1,000 mg  1,000 mg Oral Q8H Nappo, [name], MD   1,000 mg at 04/17/19 1308  . aspirin EC tablet 81 mg  81 mg Oral Daily Edwin Dada, MD   81 mg at 04/17/19 0854  . bisacodyl (DULCOLAX) suppository 10 mg  10 mg Rectal Daily PRN Hooten, Laurice Record, MD      . Chlorhexidine Gluconate Cloth 2 % PADS 6 each  6 each Topical Daily Para Skeans, MD   6 each at 04/15/19 2030  . enoxaparin (LOVENOX) injection 30 mg  30 mg Subcutaneous Q24H Hooten, Laurice Record, MD   30 mg at 04/17/19 0854  . gabapentin (NEURONTIN) capsule 100 mg  100 mg Oral QHS Florina Ou V, MD   100 mg at 04/16/19 2056  . hydrochlorothiazide (MICROZIDE) capsule 12.5 mg  12.5 mg Oral Daily Dereck Leep, MD   12.5 mg at 04/17/19 0856  . HYDROmorphone (DILAUDID) injection 0.5 mg  0.5 mg Intravenous Q6H PRN Para Skeans, MD   0.5 mg at 04/15/19 0955  . ibuprofen (ADVIL) tablet 400 mg  400 mg Oral BID PRN Danford, Suann Larry, MD      .  insulin aspart (novoLOG) injection 0-9 Units  0-9 Units Subcutaneous TID WC Gertha Calkin, MD   1 Units at 04/17/19 1308  . latanoprost (XALATAN) 0.005 % ophthalmic solution 1 drop  1 drop Both Eyes QHS Gertha Calkin, MD   1 drop at 04/16/19 2149  . lisinopril (ZESTRIL) tablet 20 mg  20 mg Oral Daily Hooten, Illene Labrador, MD   20 mg at 04/17/19 0856  . magnesium hydroxide (MILK OF MAGNESIA) suspension 30 mL  30 mL Oral Daily PRN Hooten, Illene Labrador, MD   30 mL at 04/16/19 0830  . magnesium oxide (MAG-OX) tablet 400 mg  400 mg Oral Daily Hooten, Illene Labrador, MD   400 mg at 04/17/19 0856  . metoprolol tartrate (LOPRESSOR) tablet 12.5 mg  12.5 mg Oral BID Alberteen Sam, MD   12.5 mg at 04/17/19 0857  . multivitamin-lutein (OCUVITE-LUTEIN) capsule 1 capsule  1  capsule Oral Daily Hooten, Illene Labrador, MD   1 capsule at 04/17/19 0858  . ondansetron (ZOFRAN) 8 mg in sodium chloride 0.9 % 50 mL IVPB  8 mg Intravenous Q8H PRN Irena Cords V, MD      . oxyCODONE (Oxy IR/ROXICODONE) immediate release tablet 10 mg  10 mg Oral Q4H PRN Nappo, Osvaldo Human, MD      . oxyCODONE (Oxy IR/ROXICODONE) immediate release tablet 5 mg  5 mg Oral Q4H PRN Nappo, Osvaldo Human, MD   5 mg at 04/17/19 0856  . pantoprazole (PROTONIX) EC tablet 40 mg  40 mg Oral BID Donato Heinz, MD   40 mg at 04/17/19 0855  . pravastatin (PRAVACHOL) tablet 20 mg  20 mg Oral q1800 Hooten, Illene Labrador, MD   20 mg at 04/16/19 1711  . senna-docusate (Senokot-S) tablet 1 tablet  1 tablet Oral BID Donato Heinz, MD   1 tablet at 04/17/19 0859  . sodium chloride flush (NS) 0.9 % injection 3 mL  3 mL Intravenous Q12H Gertha Calkin, MD   3 mL at 04/17/19 0859  . sodium phosphate (FLEET) 7-19 GM/118ML enema 1 enema  1 enema Rectal Daily PRN Hooten, Illene Labrador, MD      . vitamin B-12 (CYANOCOBALAMIN) tablet 1,000 mcg  1,000 mcg Oral Daily Hooten, Illene Labrador, MD   1,000 mcg at 04/17/19 1245     Discharge Medications: Please see discharge summary for a list of discharge medications.  Relevant Imaging Results:  Relevant Lab Results:   Additional Information SS# [id number]  Barrie Dunker, RN

## 2019-04-17 NOTE — TOC Initial Note (Signed)
Transition of Care [facility]) - Initial/Assessment Note    Patient Details  Name: Amy Reid MRN: [id number] Date of Birth: Mar 06, 1930  [facility]) CM/SW Contact:    Su Hilt, RN Phone Number: 04/17/2019, 2:56 PM  Clinical Narrative:                  Met with the patient an dher son in the room, they agree that the plan may be to go to SNF even short term, They agree to do the FL2, PASSR and bed search will review the bed offers once obtained  Expected Discharge Plan: Skilled Nursing Facility Barriers to Discharge: Continued Medical Work up   Patient Goals and CMS Choice Patient states their goals for this hospitalization and ongoing recovery are:: get home      Expected Discharge Plan and Services Expected Discharge Plan: [place]                                              Prior Living Arrangements/Services   Lives with:: Self                   Activities of Daily Living Home Assistive Devices/Equipment: None ADL Screening (condition at time of admission) Patient's cognitive ability adequate to safely complete daily activities?: No Is the patient deaf or have difficulty hearing?: Yes Does the patient have difficulty seeing, even when wearing glasses/contacts?: No Does the patient have difficulty concentrating, remembering, or making decisions?: No Patient able to express need for assistance with ADLs?: Yes Does the patient have difficulty dressing or bathing?: No Independently performs ADLs?: Yes (appropriate for developmental age) Does the patient have difficulty walking or climbing stairs?: No Weakness of Legs: None Weakness of Arms/Hands: None  Permission Sought/Granted                  Emotional Assessment              Admission diagnosis:  Syncope and collapse [R55] Closed intertrochanteric fracture of hip, left, initial encounter [facility]) [S72.142A] Intertrochanteric fracture of right  hip, closed, initial encounter [facility]) [S72.141A] Patient Active Problem List   Diagnosis Date Noted  . Syncope and collapse 04/15/2019  . Hip fracture requiring operative repair, right, closed, initial encounter ([place]) 04/15/2019  . Hypertension   . Diabetes mellitus without complication ([place])   . Intertrochanteric fracture of right hip, closed, initial encounter ([place])   . Closed intertrochanteric fracture of hip, left, initial encounter ([place])    PCP:  Sofie Hartigan, MD Pharmacy:   [facility] [place], [address] [place] [address] Phone: [phone] Fax: [phone]     Social Determinants of Health (SDOH) Interventions    Readmission Risk Interventions No flowsheet data found.

## 2019-04-17 NOTE — TOC Progression Note (Signed)
Transition of Care [facility]) - Progression Note    Patient Details  Name: Amy Reid MRN: [id number] Date of Birth: 06-Sep-1929  [facility] of [facility]) CM/SW Contact  Su Hilt, RN Phone Number: 04/17/2019, 3:14 PM  Clinical Narrative:    PASSR syytem is down, PASSR was started but unable to obtain the PASSR number, FL2 and bed search sent, will review the bed offers once obtained   Expected Discharge Plan: [place] Barriers to Discharge: Continued Medical Work up  Expected Discharge Plan and Services Expected Discharge Plan: [place]                                               Social Determinants of Health (SDOH) Interventions    Readmission Risk Interventions No flowsheet data found.

## 2019-04-17 NOTE — Plan of Care (Signed)
Patient has rested quietly this shift.  Prn pain med given.  Son at bedside.

## 2019-04-17 NOTE — Progress Notes (Signed)
PROGRESS NOTE    Amy Reid  UMP:[id number] DOB: [birth year] DOA: 04/15/2019 PCP: Amy Hartigan, MD      Brief Narrative:  Amy Reid is a 85 y.o. F with HTN, DM, recent TIA who presented with syncope, fall and right hip pain/inability to walk.  Has had new dizziness and imbalance for last few months.  Morning of admission, patient was going to use the restroom, fell between the commode and wall.  Guest in home found her and called 9-1-1.    In the ER, radiograph showed a RIGHT hip fracture.  Orthopedics were consulted for repair.       Assessment & Plan:  Acute RIGHT hip fracture S/p intramedullary nail 11/21 by Dr. Gaspar Bidding    Post-operative acute blood loss anemia Hgb 13 on admisison, today down to 7.3 g/dL.  Patient asymptomatic this morning. -Transfusion threshold 7 g/dL, or symptoms  Syncope Admitting differential vasovagal episode, orthostasis, TIA, seizure, GCA, intermittent pressure hydrocephalus, subclavian steal, vertebrobasilar insufficiency, arrhythmia. Patient and son at the bedside are able to describe a fairly clear pattern of lightheadedness/head rash/vision dimming that occurs shortly after standing, resolves with time and standing still. She has no other neurological deficits to suggest vertebrobasilar insufficiency. Distal pulses are normal, ruling out subclavian steal. She has no vision symptoms or headache to suggest GCA. Unable to do orthostatics due to fracture.   -Obtain orthostatic vital signs when able to stand  Non-sustained SVT Very brief narrow complex tachyarrhythmia noted on tele. -Resume telemetry -Start metoprolol low dose  Diabetes with neuropathy Glucoses well contrlled -Continue SS corrections -Continue gabapentin -Hold Actos  Hypertension Cerebrovascular disease  BP controlled -Stop HCTZ -Hold lisinopril, resume when BP improves -continue metoprolol  -Resume aspirin, statin  CKD IIIa Cr stable relative to  baseline        MDM and disposition: The below labs and imaging reports were reviewed and summarized above.  Medication management as above.  The patient was admitted with hip fracture.  Her postoperative course has been complicated by anemia. In the meantime we are working up for orthostatic hypotension.    CSW working on placement, PASSAR pending   DVT prophylaxis: Aspirin and Lovenox Code Status: Full code Family Communication: Son at the bedside    Consultants:   Dr. Gaspar Bidding  Procedures:   11/21 intramedullary nailing  Antimicrobials:       Subjective: No dyspnea, chest pain, palpitations, fatigue, dizziness, weakness. Has minimal pain in the hip. No cough, vomiting. Wants to work with therapist today, son at bedside    Objective: Vitals:   04/16/19 2319 04/17/19 0405 04/17/19 0758 04/17/19 1525  BP: (!) 140/50 (!) 145/47 (!) 128/52 (!) 104/44  Pulse: 100 99 94 86  Resp: 18 18 16 18   Temp: 99.9 F (37.7 C) 99.5 F (37.5 C) 99.1 F (37.3 C) 98.6 F (37 C)  TempSrc: Oral Oral Oral Oral  SpO2: 92% 92% 93% 93%  Weight:      Height:        Intake/Output Summary (Last 24 hours) at 04/17/2019 2013 Last data filed at 04/17/2019 1900 Gross per 24 hour  Intake 240 ml  Output 1500 ml  Net -1260 ml   Filed Weights   04/15/19 0635  Weight: 56.7 kg    Examination: General appearance: Thin elderly adult female, alert and in no acute distress.   HEENT: Anicteric, conjunctiva pink, lids and lashes normal. No nasal deformity, discharge, epistaxis.  Lips moist.   Skin: Warm and dry.  No jaundice.  No suspicious rashes or lesions. Cardiac: RRR, nl S1-S2, no murmurs appreciated.  Capillary refill is brisk.  JVP normal.  No LE edema.  Radial pulses 2+ and symmetric. Respiratory: Normal respiratory rate and rhythm. Diminished air entry, diminished respiratory effort. Abdomen: Abdomen soft. No TTP or guarding. No ascites, distension, hepatosplenomegaly.   MSK: No  deformities or effusions. Diffuse loss of subcutaneous muscle mass and fat. Neuro: Awake and alert.  EOMI, moves upper extremities with normal strength and coordination, lower extremity strength testing not tested due to fracture. No nystagmus. Upper arm coordination appear normal. Speech fluent.    Psych: Sensorium intact and responding to questions, attention normal. Affect normal  Judgment and insight appear normal.    Data Reviewed: I have personally reviewed following labs and imaging studies:  CBC: Recent Labs  Lab 04/15/19 0638 04/16/19 0545 04/17/19 0526  WBC 10.0 7.2 9.0  NEUTROABS 6.6  --   --   HGB 13.1 7.5* 7.3*  HCT 37.6 22.2* 20.6*  MCV 88.7 92.5 87.3  PLT 173 123* 126*   Basic Metabolic Panel: Recent Labs  Lab 04/15/19 0638 04/16/19 0545 04/17/19 0526  NA 138 135 136  K 3.5 4.0 3.9  CL 104 102 102  CO2 24 24 22   GLUCOSE 164* 164* 138*  BUN 39* 32* 27*  CREATININE 1.39* 1.19* 1.12*  CALCIUM 9.4 8.2* 8.6*   GFR: Estimated Creatinine Clearance: 30.6 mL/min (A) (by C-G formula based on SCr of 1.12 mg/dL (H)). Liver Function Tests: Recent Labs  Lab 04/15/19 0638  AST 24  ALT 18  ALKPHOS 42  BILITOT 0.7  PROT 6.8  ALBUMIN 3.9   No results for input(s): LIPASE, AMYLASE in the last 168 hours. No results for input(s): AMMONIA in the last 168 hours. Coagulation Profile: No results for input(s): INR, PROTIME in the last 168 hours. Cardiac Enzymes: No results for input(s): CKTOTAL, CKMB, CKMBINDEX, TROPONINI in the last 168 hours. BNP (last 3 results) No results for input(s): PROBNP in the last 8760 hours. HbA1C: No results for input(s): HGBA1C in the last 72 hours. CBG: Recent Labs  Lab 04/16/19 1159 04/16/19 1700 04/17/19 0759 04/17/19 1249 04/17/19 1653  GLUCAP 121* 156* 118* 133* 143*   Lipid Profile: No results for input(s): CHOL, HDL, LDLCALC, TRIG, CHOLHDL, LDLDIRECT in the last 72 hours. Thyroid Function Tests: No results for  input(s): TSH, T4TOTAL, FREET4, T3FREE, THYROIDAB in the last 72 hours. Anemia Panel: No results for input(s): VITAMINB12, FOLATE, FERRITIN, TIBC, IRON, RETICCTPCT in the last 72 hours. Urine analysis:    Component Value Date/Time   COLORURINE Yellow 08/17/2013 0649   APPEARANCEUR Hazy 08/17/2013 0649   LABSPEC 1.016 08/17/2013 0649   PHURINE 5.0 08/17/2013 0649   GLUCOSEU Negative 08/17/2013 0649   HGBUR 1+ 08/17/2013 0649   BILIRUBINUR Negative 08/17/2013 0649   KETONESUR Trace 08/17/2013 0649   PROTEINUR Negative 08/17/2013 0649   NITRITE Negative 08/17/2013 0649   LEUKOCYTESUR Negative 08/17/2013 0649   Sepsis Labs: @LABRCNTIP (procalcitonin:4,lacticacidven:4)  ) Recent Results (from the past 240 hour(s))  SARS Coronavirus 2 by RT PCR (hospital order, [facility]) Nasopharyngeal Nasopharyngeal Swab     Status: None   Collection Time: 04/15/19  8:09 AM   Specimen: Nasopharyngeal Swab  Result Value Ref Range Status   SARS Coronavirus 2 NEGATIVE NEGATIVE Final    Comment: (NOTE) If result is NEGATIVE SARS-CoV-2 target nucleic acids are NOT DETECTED. The SARS-CoV-2 RNA is generally detectable in upper  and lower  respiratory specimens during the acute phase of infection. The lowest  concentration of SARS-CoV-2 viral copies this assay can detect is 250  copies / mL. A negative result does not preclude SARS-CoV-2 infection  and should not be used as the sole basis for treatment or other  patient management decisions.  A negative result may occur with  improper specimen collection / handling, submission of specimen other  than nasopharyngeal swab, presence of viral mutation(s) within the  areas targeted by this assay, and inadequate number of viral copies  (<250 copies / mL). A negative result must be combined with clinical  observations, patient history, and epidemiological information. If result is POSITIVE SARS-CoV-2 target nucleic acids are  DETECTED. The SARS-CoV-2 RNA is generally detectable in upper and lower  respiratory specimens dur ing the acute phase of infection.  Positive  results are indicative of active infection with SARS-CoV-2.  Clinical  correlation with patient history and other diagnostic information is  necessary to determine patient infection status.  Positive results do  not rule out bacterial infection or co-infection with other viruses. If result is PRESUMPTIVE POSTIVE SARS-CoV-2 nucleic acids MAY BE PRESENT.   A presumptive positive result was obtained on the submitted specimen  and confirmed on repeat testing.  While 2019 novel coronavirus  (SARS-CoV-2) nucleic acids may be present in the submitted sample  additional confirmatory testing may be necessary for epidemiological  and / or clinical management purposes  to differentiate between  SARS-CoV-2 and other Sarbecovirus currently known to infect humans.  If clinically indicated additional testing with an alternate test  methodology [phone]) is advised. The SARS-CoV-2 RNA is generally  detectable in upper and lower respiratory sp ecimens during the acute  phase of infection. The expected result is Negative. Fact Sheet for Patients:  BoilerBrush.com.cy Fact Sheet for Healthcare Providers: https://pope.com/ This test is not yet approved or cleared by the [place] FDA and has been authorized for detection and/or diagnosis of SARS-CoV-2 by FDA under an Emergency Use Authorization (EUA).  This EUA will remain in effect (meaning this test can be used) for the duration of the COVID-19 declaration under Section 564(b)(1) of the Act, 21 U.S.C. section 360bbb-3(b)(1), unless the authorization is terminated or revoked sooner. Performed at [facility], [address], [place], [place] [address]          Radiology Studies: No results found.      Scheduled Meds: . acetaminophen   1,000 mg Oral Q8H  . aspirin EC  81 mg Oral Daily  . Chlorhexidine Gluconate Cloth  6 each Topical Daily  . enoxaparin (LOVENOX) injection  30 mg Subcutaneous Q24H  . gabapentin  100 mg Oral QHS  . hydrochlorothiazide  12.5 mg Oral Daily  . insulin aspart  0-9 Units Subcutaneous TID WC  . latanoprost  1 drop Both Eyes QHS  . lisinopril  20 mg Oral Daily  . magnesium oxide  400 mg Oral Daily  . metoprolol tartrate  12.5 mg Oral BID  . multivitamin-lutein  1 capsule Oral Daily  . pantoprazole  40 mg Oral BID  . pravastatin  20 mg Oral q1800  . senna-docusate  1 tablet Oral BID  . sodium chloride flush  3 mL Intravenous Q12H  . vitamin B-12  1,000 mcg Oral Daily   Continuous Infusions: . ondansetron (ZOFRAN) IV       LOS: 2 days    Time spent: 25 minutes    Delfino Lovett, MD [facility] 04/17/2019, 8:13 PM     Please page though AMION or Epic secure chat:  For [employer], Higher education careers adviser

## 2019-04-17 NOTE — Progress Notes (Signed)
Physical Therapy Treatment Patient Details Name: Amy Reid MRN: [id number] DOB: [birth year] Today's Date: 04/17/2019    History of Present Illness From MD notes: Pt is an 85 y.o. female with medical history significant of HTN, Rec TIA, DM II seen in ED for syncope and fall and rt hip fracture.  Pt is s/p closed reduction and short cephalomedullary nail of a R intertrochanteric femur fracture.  MD assessment also includes: post op blood loss anemia, syncope, non-sustained SVT, DM with neuropathy, HTN, and CKD III.    PT Comments    Pt presented with deficits in strength, transfers, mobility, gait, balance, and activity tolerance.  Supine therex only this session secondary to pt's pain with OOB activity during the AM session combined with pt fatigue and mild lethargy with Hgb 7.3 in AM.  Pt tolerated below therex well but required frequent therapeutic rest breaks and occasional cues for increased participation.  Pt will benefit from PT services in a SNF setting upon discharge to safely address above deficits for decreased caregiver assistance and eventual return to PLOF.     Follow Up Recommendations  SNF     Equipment Recommendations  Rolling walker with 5" wheels;3in1 (PT)    Recommendations for Other Services       Precautions / Restrictions Precautions Precautions: Fall Precaution Comments: Blind in R eye Restrictions Weight Bearing Restrictions: Yes RLE Weight Bearing: Weight bearing as tolerated    Mobility  Bed Mobility Overal bed mobility: Needs Assistance Bed Mobility: Rolling;Sidelying to Sit;Sit to Sidelying Rolling: Mod assist Sidelying to sit: Max assist     Sit to sidelying: Max assist General bed mobility comments: NT this session  Transfers Overall transfer level: Needs assistance Equipment used: Rolling walker (2 wheeled) Transfers: Sit to/from Stand Sit to Stand: From elevated surface;Mod assist         General transfer comment: NT this  session  Ambulation/Gait             General Gait Details: Unable   Stairs             Wheelchair Mobility    Modified Rankin (Stroke Patients Only)       Balance Overall balance assessment: Needs assistance   Sitting balance-Leahy Scale: Fair   Postural control: Posterior lean Standing balance support: Bilateral upper extremity supported Standing balance-Leahy Scale: Poor Standing balance comment: Frequent min A to prevent posterior LOB in standing                            Cognition Arousal/Alertness: Awake/alert Behavior During Therapy: WFL for tasks assessed/performed Overall Cognitive Status: Within Functional Limits for tasks assessed                                        Exercises Total Joint Exercises Ankle Circles/Pumps: Strengthening;Both;10 reps;Other reps (comment)(2x10 reps with manual resistance) Quad Sets: Strengthening;Both;10 reps;Other reps (comment)(2x10 reps) Gluteal Sets: Strengthening;Both;10 reps;Other reps (comment)(2x10 reps) Towel Squeeze: Strengthening;Both;5 reps Short Arc Quad: Strengthening;Both;10 reps;Other reps (comment)(2x10 reps with manual resistance) Heel Slides: AAROM;Both;10 reps;Other reps (comment)(2x10 reps) Hip ABduction/ADduction: AAROM;Both;10 reps(2x10 reps) Straight Leg Raises: AAROM;Both;10 reps;Other reps (comment)(2x10 reps) Long Arc Quad: AROM;Both;10 reps;5 reps Knee Flexion: AROM;Both;10 reps;5 reps Marching in Standing: AROM;Right;5 reps Other Exercises Other Exercises: HEP education/review with pt and son for BLE APs, QS, and GS x 10 each every 1-2  hours    General Comments        Pertinent Vitals/Pain Pain Assessment: 0-10 Pain Score: 3  Pain Location: R hip Pain Descriptors / Indicators: Aching;Sore Pain Intervention(s): Premedicated before session;Monitored during session    Home Living Family/patient expects to be discharged to:: Private residence Living  Arrangements: Alone Available Help at Discharge: Family;Available PRN/intermittently Type of Home: House Home Access: Stairs to enter Entrance Stairs-Rails: None Home Layout: One level Home Equipment: Cane - quad;Cane - single point;Grab bars - tub/shower Additional Comments: Family may be able to pay for caregiver assist once pt returns home    Prior Function Level of Independence: Independent      Comments: Ind amb community distances without an AD, 3 total falls in the last 6 months all from "passing out", Ind with ADLs, dances 1x/wk, works in the yard   PT Goals (current goals can now be found in the care plan section) Acute Rehab PT Goals Patient Stated Goal: To get back to the way I was PT Goal Formulation: With patient Time For Goal Achievement: 04/30/19 Potential to Achieve Goals: Good Progress towards PT goals: Progressing toward goals    Frequency    BID      PT Plan Current plan remains appropriate    Co-evaluation              AM-PAC PT "6 Clicks" Mobility   Outcome Measure  Help needed turning from your back to your side while in a flat bed without using bedrails?: A Lot Help needed moving from lying on your back to sitting on the side of a flat bed without using bedrails?: A Lot Help needed moving to and from a bed to a chair (including a wheelchair)?: Total Help needed standing up from a chair using your arms (e.g., wheelchair or bedside chair)?: A Lot Help needed to walk in hospital room?: Total Help needed climbing 3-5 steps with a railing? : Total 6 Click Score: 9    End of Session Equipment Utilized During Treatment: Gait belt Activity Tolerance: Patient limited by pain Patient left: in bed;with bed alarm set;with call bell/phone within reach;with family/visitor present;with SCD's reapplied Nurse Communication: Mobility status PT Visit Diagnosis: Unsteadiness on feet (R26.81);History of falling (Z91.81);Other abnormalities of gait and mobility  (R26.89);Muscle weakness (generalized) (M62.81)     Time: 0488-8916 PT Time Calculation (min) (ACUTE ONLY): 30 min  Charges:  $Therapeutic Exercise: 23-37 mins $Therapeutic Activity: 8-22 mins                     D. Scott Arely Tinner PT, DPT 04/17/19, 4:08 PM

## 2019-04-17 NOTE — Progress Notes (Signed)
  Subjective: 2 Days Post-Op Procedure(s) (LRB): INTRAMEDULLARY (IM) NAIL INTERTROCHANTRIC (Right) Patient reports pain as well-controlled.   Patient is well, and has had no acute complaints or problems Negative for chest pain and shortness of breath Fever: no Gastrointestinal: negative for nausea and vomiting.  Patient has not had a bowel movement.  Objective: Vital signs in last 24 hours: Temp:  [98.7 F (37.1 C)-100.2 F (37.9 C)] 99.5 F (37.5 C) (11/23 0405) Pulse Rate:  [82-100] 99 (11/23 0405) Resp:  [18] 18 (11/23 0405) BP: (102-145)/(47-74) 145/47 (11/23 0405) SpO2:  [92 %-99 %] 92 % (11/23 0405)  Intake/Output from previous day:  Intake/Output Summary (Last 24 hours) at 04/17/2019 0657 Last data filed at 04/17/2019 0406 Gross per 24 hour  Intake 630 ml  Output 1200 ml  Net -570 ml    Intake/Output this shift: Total I/O In: -  Out: 1200 [Urine:1200]  Labs: Recent Labs    04/15/19 0638 04/16/19 0545 04/17/19 0526  HGB 13.1 7.5* 7.3*   Recent Labs    04/16/19 0545 04/17/19 0526  WBC 7.2 9.0  RBC 2.40* 2.36*  HCT 22.2* 20.6*  PLT 123* 126*   Recent Labs    04/16/19 0545 04/17/19 0526  NA 135 136  K 4.0 3.9  CL 102 102  CO2 24 22  BUN 32* 27*  CREATININE 1.19* 1.12*  GLUCOSE 164* 138*  CALCIUM 8.2* 8.6*   No results for input(s): LABPT, INR in the last 72 hours.   EXAM General - Patient is Alert, Appropriate and Oriented Extremity - Neurovascular intact Dorsiflexion/Plantar flexion intact Compartment soft Dressing/Incision -clean, no drainage Motor Function - intact, moving foot and toes well on exam.   Cardiovascular- Regular rate and rhythm, no murmurs/rubs/gallops Respiratory- Lungs clear to auscultation bilaterally Gastrointestinal- soft, nontender and active bowel sounds   Assessment/Plan: 2 Days Post-Op Procedure(s) (LRB): INTRAMEDULLARY (IM) NAIL INTERTROCHANTRIC (Right) Principal Problem:   Syncope and collapse Active  Problems:   Hip fracture requiring operative repair, right, closed, initial encounter ([place])   Hypertension   Diabetes mellitus without complication ([place])   Closed intertrochanteric fracture of hip, left, initial encounter ([place])  Estimated body mass index is 21.46 kg/m as calculated from the following:   Height as of this encounter: 5\' 4"  (1.626 m).   Weight as of this encounter: 56.7 kg. Advance diet Up with therapy    DVT Prophylaxis - Lovenox, Ted hose and SCDs Weight-Bearing as tolerated to right leg  Cassell Smiles, PA-C [facility]opaedic Surgery 04/17/2019, 6:57 AM

## 2019-04-18 LAB — BASIC METABOLIC PANEL
Anion gap: 10 (ref 5–15)
BUN: 28 mg/dL — ABNORMAL HIGH (ref 8–23)
CO2: 25 mmol/L (ref 22–32)
Calcium: 8.7 mg/dL — ABNORMAL LOW (ref 8.9–10.3)
Chloride: 102 mmol/L (ref 98–111)
Creatinine, Ser: 1.05 mg/dL — ABNORMAL HIGH (ref 0.44–1.00)
GFR calc Af Amer: 55 mL/min — ABNORMAL LOW (ref >60)
GFR calc non Af Amer: 48 mL/min — ABNORMAL LOW (ref >60)
Glucose, Bld: 122 mg/dL — ABNORMAL HIGH (ref 70–99)
Potassium: 3.9 mmol/L (ref 3.5–5.1)
Sodium: 137 mmol/L (ref 135–145)

## 2019-04-18 LAB — GLUCOSE, CAPILLARY
Glucose-Capillary: 134 mg/dL — ABNORMAL HIGH (ref 70–99)
Glucose-Capillary: 129 mg/dL — ABNORMAL HIGH (ref 70–99)
Glucose-Capillary: 130 mg/dL — ABNORMAL HIGH (ref 70–99)
Glucose-Capillary: 160 mg/dL — ABNORMAL HIGH (ref 70–99)
Glucose-Capillary: 145 mg/dL — ABNORMAL HIGH (ref 70–99)

## 2019-04-18 LAB — CBC
HCT: 20.8 % — ABNORMAL LOW (ref 36.0–46.0)
Hemoglobin: 7.5 g/dL — ABNORMAL LOW (ref 12.0–15.0)
MCH: 31.8 pg (ref 26.0–34.0)
MCHC: 36.1 g/dL — ABNORMAL HIGH (ref 30.0–36.0)
MCV: 88.1 fL (ref 80.0–100.0)
Platelets: 135 10*3/uL — ABNORMAL LOW (ref 150–400)
RBC: 2.36 MIL/uL — ABNORMAL LOW (ref 3.87–5.11)
RDW: 15.6 % — ABNORMAL HIGH (ref 11.5–15.5)
WBC: 10.6 10*3/uL — ABNORMAL HIGH (ref 4.0–10.5)
nRBC: 0.2 % (ref 0.0–0.2)

## 2019-04-18 MED ORDER — ENOXAPARIN SODIUM 40 MG/0.4ML ~~LOC~~ SOLN
40.0000 mg | SUBCUTANEOUS | Status: DC
  Administered 2019-04-18 – 2019-04-21 (×4): 40 mg via SUBCUTANEOUS
  Filled 2019-04-18 (×4): qty 0.4

## 2019-04-18 MED ORDER — FERROUS SULFATE 325 (65 FE) MG PO TABS
325.0000 mg | ORAL_TABLET | Freq: Every day | ORAL | Status: DC
  Administered 2019-04-18 – 2019-04-21 (×4): 325 mg via ORAL
  Filled 2019-04-18 (×4): qty 1

## 2019-04-18 MED ORDER — DOCUSATE SODIUM 100 MG PO CAPS
100.0000 mg | ORAL_CAPSULE | Freq: Every day | ORAL | Status: DC
  Administered 2019-04-18 – 2019-04-21 (×4): 100 mg via ORAL
  Filled 2019-04-18 (×4): qty 1

## 2019-04-18 NOTE — Care Management Important Message (Signed)
Important Message  Patient Details  Name: Amy Reid MRN: [id number] Date of Birth: 06-May-1930   Medicare Important Message Given:  Yes     Juliann Pulse A Jessice Madill 04/18/2019, 11:04 AM

## 2019-04-18 NOTE — Progress Notes (Signed)
PHARMACIST - PHYSICIAN COMMUNICATION  CONCERNING:  Enoxaparin (Lovenox) for DVT Prophylaxis    RECOMMENDATION: Patient was prescribed enoxaprin 30mg  q24 hours for VTE prophylaxis.   Filed Weights   04/15/19 0635 04/18/19 0500  Weight: 125 lb (56.7 kg) 171 lb (77.6 kg)    Body mass index is 29.35 kg/m.  Estimated Creatinine Clearance: 38.1 mL/min (A) (by C-G formula based on SCr of 1.05 mg/dL (H)).  Patient is candidate for enoxaparin 40mg  every 24 hours based on CrCl >49ml/min AND Weight > 45kg for women  (renal function has improved since admission, HGB now stable)  DESCRIPTION: Pharmacy has adjusted enoxaparin dose per [facility] policy.  Patient is now receiving enoxaparin 40mg  every 24 hours.  Lu Duffel, PharmD, BCPS Clinical Pharmacist 04/18/2019 7:55 AM

## 2019-04-18 NOTE — Plan of Care (Signed)
Patient slow to progress with PT. Pt and son spoke with Dr. Loleta Books at length- patient does not want a blood transfusion if possible.  Iron supplement ordered.  Medicated for pain with prn oxy ir. Son at bedside.  Will continue to monitor.

## 2019-04-18 NOTE — Progress Notes (Signed)
PROGRESS NOTE    Amy Reid  ZOX:[id number] DOB: 28-Dec-1929 DOA: 04/15/2019 PCP: Marina Goodell, MD      Brief Narrative:  Amy Reid is a 85 y.o. F with HTN, DM, recent TIA who presented with syncope, fall and right hip pain/inability to walk.  Has had new dizziness and imbalance for last few months.  Morning of admission, patient was going to use the restroom, fell between the commode and wall.  Guest in home found her and called 9-1-1.    In the ER, radiograph showed a RIGHT hip fracture.  Orthopedics were consulted for repair.       Assessment & Plan:  Acute RIGHT hip fracture S/p intramedullary nail 11/21 by Dr. Franco Collet The pedis, appreciate recommendations -Continue Lovenox for DVT prophylaxis -WBAT to right leg   Post-operative acute blood loss anemia Hgb 13 on admisison, postoperatively down to 7.5 g/dL, stable since.  Patient is asymptomatic. -Transfusion threshold 7 g/dL, or symptoms -Start iron and docusate  Syncope Admitting differential vasovagal episode, orthostasis, TIA, seizure, GCA, intermittent pressure hydrocephalus, subclavian steal, vertebrobasilar insufficiency, arrhythmia. Patient and son at the bedside are able to describe a fairly clear pattern of lightheadedness/head rash/vision dimming that occurs shortly after standing, resolves with time and standing still. She has no other neurological deficits to suggest vertebrobasilar insufficiency. Distal pulses are normal, ruling out subclavian steal. She has no vision symptoms or headache to suggest GCA. -Follow-up orthostatic hypotension with PCP  Non-sustained SVT No further tachyarrhythmia on telemetry -Stop metoprolol  Diabetes with neuropathy Glucoses controlled -Continue SS corrections -Continue gabapentin -Continue statin -Hold Actos  Hypertension Cerebrovascular disease  BP normal -Continue HCTZ and lisinopril   -Continue aspirin, statin  CKD IIIa Cr stable relative to  baseline        MDM and disposition: The below labs and imaging reports reviewed and summarized above.  Medication management as above.  The patient was admitted with hip fracture.  Her postoperative course has been complicated by anemia.   She is now improving.  At present we do not have a safe discharge plan, will continue to work with physical therapy in anticipation of discharge either to home tomorrow or to skilled nursing facility.      DVT prophylaxis: Aspirin and Lovenox Code Status: Full code Family Communication: Son    Consultants:   Dr. Franco Collet  Procedures:   11/21 intramedullary nailing  Antimicrobials:       Subjective: She is fatigued, but this is baseline for Lasix weeks.  Otherwise no chest pain, dyspnea, palpitations, dizziness with standing.  The hip is still sore, but stable.    Objective: Vitals:   04/17/19 1525 04/18/19 0041 04/18/19 0500 04/18/19 0812  BP: (!) 104/44 (!) 118/46  (!) 142/54  Pulse: 86 99  94  Resp: Temp: 98.6 F (37 C) 98.4 F (36.9 C)  98.7 F (37.1 C)  TempSrc: Oral Oral  Oral  SpO2: 93% 93%  93%  Weight:   77.6 kg   Height:        Intake/Output Summary (Last 24 hours) at 04/18/2019 1027 Last data filed at 04/18/2019 1007 Gross per 24 hour  Intake 360 ml  Output 600 ml  Net -240 ml   Filed Weights   04/15/19 0635 04/18/19 0500  Weight: 56.7 kg 77.6 kg    Examination: General appearance: Thin elderly adult female, alert and in no acute distress.   HEENT: Anicteric, conjunctiva pink, lids and lashes normal.  No nasal deformity, discharge, epistaxis.  Lips moist, teeth normal. OP normal, no oral lesions.  Hearing diminished. Skin: Warm and dry.  No suspicious rashes or lesions. Cardiac: RRR, no murmurs appreciated.  JVP normal, no LE edema.    Respiratory: Normal respiratory rate and rhythm.  CTAB without rales or wheezes. Abdomen: Abdomen soft.  No tenderness to palpation or guarding. No  ascites, distension, hepatosplenomegaly.   MSK: Guards the right hip.  Diffuse loss of subcutaneous muscle mass and fat, mild, regular for age. Neuro: Awake and alert. Naming is grossly intact, and the patient's recall, recent and remote, as well as general fund of knowledge seem within normal limits.  Muscle tone diminished, without fasciculations.  Moves upper extremities with generalized weakness, extreme not tested due to fracture. Speech fluent.    Psych: Sensorium intact and responding to questions, attention normal. Affect normal.  Judgment and insight appear normal.      Data Reviewed: I have personally reviewed following labs and imaging studies:  CBC: Recent Labs  Lab 04/15/19 0638 04/16/19 0545 04/17/19 0526 04/18/19 0521  WBC 10.0 7.2 9.0 10.6*  NEUTROABS 6.6  --   --   --   HGB 13.1 7.5* 7.3* 7.5*  HCT 37.6 22.2* 20.6* 20.8*  MCV 88.7 92.5 87.3 88.1  PLT 173 123* 126* 135*   Basic Metabolic Panel: Recent Labs  Lab 04/15/19 0638 04/16/19 0545 04/17/19 0526 04/18/19 0521  NA 138 135 136 137  K 3.5 4.0 3.9 3.9  CL 104 102 102 102  CO2 24 24 22 25   GLUCOSE 164* 164* 138* 122*  BUN 39* 32* 27* 28*  CREATININE 1.39* 1.19* 1.12* 1.05*  CALCIUM 9.4 8.2* 8.6* 8.7*   GFR: Estimated Creatinine Clearance: 38.1 mL/min (A) (by C-G formula based on SCr of 1.05 mg/dL (H)). Liver Function Tests: Recent Labs  Lab 04/15/19 0638  AST 24  ALT 18  ALKPHOS 42  BILITOT 0.7  PROT 6.8  ALBUMIN 3.9   No results for input(s): LIPASE, AMYLASE in the last 168 hours. No results for input(s): AMMONIA in the last 168 hours. Coagulation Profile: No results for input(s): INR, PROTIME in the last 168 hours. Cardiac Enzymes: No results for input(s): CKTOTAL, CKMB, CKMBINDEX, TROPONINI in the last 168 hours. BNP (last 3 results) No results for input(s): PROBNP in the last 8760 hours. HbA1C: No results for input(s): HGBA1C in the last 72 hours. CBG: Recent Labs  Lab 04/16/19  1700 04/17/19 0759 04/17/19 1249 04/17/19 1653 04/18/19 0749  GLUCAP 156* 118* 133* 143* 129*   Lipid Profile: No results for input(s): CHOL, HDL, LDLCALC, TRIG, CHOLHDL, LDLDIRECT in the last 72 hours. Thyroid Function Tests: No results for input(s): TSH, T4TOTAL, FREET4, T3FREE, THYROIDAB in the last 72 hours. Anemia Panel: No results for input(s): VITAMINB12, FOLATE, FERRITIN, TIBC, IRON, RETICCTPCT in the last 72 hours. Urine analysis:    Component Value Date/Time   COLORURINE Yellow 08/17/2013 0649   APPEARANCEUR Hazy 08/17/2013 0649   LABSPEC 1.016 08/17/2013 0649   PHURINE 5.0 08/17/2013 0649   GLUCOSEU Negative 08/17/2013 0649   HGBUR 1+ 08/17/2013 0649   BILIRUBINUR Negative 08/17/2013 0649   KETONESUR Trace 08/17/2013 0649   PROTEINUR Negative 08/17/2013 0649   NITRITE Negative 08/17/2013 0649   LEUKOCYTESUR Negative 08/17/2013 0649   Sepsis Labs: @LABRCNTIP (procalcitonin:4,lacticacidven:4)  ) Recent Results (from the past 240 hour(s))  SARS Coronavirus 2 by RT PCR (hospital order[facility]) Nasopharyngeal Nasopharyngeal Swab  Status: None   Collection Time: 04/15/19  8:09 AM   Specimen: Nasopharyngeal Swab  Result Value Ref Range Status   SARS Coronavirus 2 NEGATIVE NEGATIVE Final    Comment: (NOTE) If result is NEGATIVE SARS-CoV-2 target nucleic acids are NOT DETECTED. The SARS-CoV-2 RNA is generally detectable in upper and lower  respiratory specimens during the acute phase of infection. The lowest  concentration of SARS-CoV-2 viral copies this assay can detect is 250  copies / mL. A negative result does not preclude SARS-CoV-2 infection  and should not be used as the sole basis for treatment or other  patient management decisions.  A negative result may occur with  improper specimen collection / handling, submission of specimen other  than nasopharyngeal swab, presence of viral mutation(s) within the  areas targeted by  this assay, and inadequate number of viral copies  (<250 copies / mL). A negative result must be combined with clinical  observations, patient history, and epidemiological information. If result is POSITIVE SARS-CoV-2 target nucleic acids are DETECTED. The SARS-CoV-2 RNA is generally detectable in upper and lower  respiratory specimens dur ing the acute phase of infection.  Positive  results are indicative of active infection with SARS-CoV-2.  Clinical  correlation with patient history and other diagnostic information is  necessary to determine patient infection status.  Positive results do  not rule out bacterial infection or co-infection with other viruses. If result is PRESUMPTIVE POSTIVE SARS-CoV-2 nucleic acids MAY BE PRESENT.   A presumptive positive result was obtained on the submitted specimen  and confirmed on repeat testing.  While 2019 novel coronavirus  (SARS-CoV-2) nucleic acids may be present in the submitted sample  additional confirmatory testing may be necessary for epidemiological  and / or clinical management purposes  to differentiate between  SARS-CoV-2 and other Sarbecovirus currently known to infect humans.  If clinically indicated additional testing with an alternate test  methodology [phone]) is advised. The SARS-CoV-2 RNA is generally  detectable in upper and lower respiratory sp ecimens during the acute  phase of infection. The expected result is Negative. Fact Sheet for Patients:  StrictlyIdeas.no Fact Sheet for Healthcare Providers: BankingDealers.co.za This test is not yet approved or cleared by the [place] FDA and has been authorized for detection and/or diagnosis of SARS-CoV-2 by FDA under an Emergency Use Authorization (EUA).  This EUA will remain in effect (meaning this test can be used) for the duration of the COVID-19 declaration under Section 564(b)(1) of the Act, 21 U.S.C. section  360bbb-3(b)(1), unless the authorization is terminated or revoked sooner. Performed at [facility], [address], [place], [place] [address]          Radiology Studies: No results found.      Scheduled Meds: . acetaminophen  1,000 mg Oral Q8H  . aspirin EC  81 mg Oral Daily  . docusate sodium  100 mg Oral Daily  . enoxaparin (LOVENOX) injection  40 mg Subcutaneous Q24H  . ferrous sulfate  325 mg Oral Q breakfast  . gabapentin  100 mg Oral QHS  . hydrochlorothiazide  12.5 mg Oral Daily  . insulin aspart  0-9 Units Subcutaneous TID WC  . latanoprost  1 drop Both Eyes QHS  . lisinopril  20 mg Oral Daily  . magnesium oxide  400 mg Oral Daily  . metoprolol tartrate  12.5 mg Oral BID  . multivitamin-lutein  1 capsule Oral Daily  . pantoprazole  40 mg Oral BID  . pravastatin  20 mg Oral q1800  . senna-docusate  1 tablet Oral BID  . sodium chloride flush  3 mL Intravenous Q12H  . vitamin B-12  1,000 mcg Oral Daily   Continuous Infusions: . ondansetron (ZOFRAN) IV       LOS: 3 days    Time spent: 25 minutes    Alberteen Sam, MD [facility] 04/18/2019, 10:27 AM     Please page though AMION or Epic secure chat:  For [employer], Higher education careers adviser

## 2019-04-18 NOTE — TOC Progression Note (Signed)
Transition of Care [facility]) - Progression Note    Patient Details  Name: [name] MRN: [id number] Date of Birth: Sep 27, 1929  Transition of [facility]) CM/SW [place], LCSW Phone Number: 04/18/2019, 4:59 PM  Clinical Narrative:  Per CSW handoff, patient's son wants her placed at [facility]. CSW received call from admissions coordinator stated she has spoken with the son and they can offer her a bed. CSW faxed clinicals to [facility] for insurance authorization review.   Expected Discharge Plan: Skilled Nursing Facility Barriers to Discharge: Continued Medical Work up  Expected Discharge Plan and Services Expected Discharge Plan: [place]                                               Social Determinants of Health (SDOH) Interventions    Readmission Risk Interventions No flowsheet data found.

## 2019-04-18 NOTE — Progress Notes (Signed)
  Subjective: 3 Days Post-Op Procedure(s) (LRB): INTRAMEDULLARY (IM) NAIL INTERTROCHANTRIC (Right)  Patient presents with her son at bedside. Son states that they are considering hiring a home nurse and aide to allow the patient to return home following discharge if possible.  Patient reports pain as well-controlled.   Patient is well, and has had no acute complaints or problems Patient would like to be discharged home if possible when after leaving the hospital. Negative for chest pain and shortness of breath Fever: no Gastrointestinal: negative for nausea and vomiting.  Patient has not had a bowel movement.  Objective: Vital signs in last 24 hours: Temp:  [98.4 F (36.9 C)-98.7 F (37.1 C)] 98.7 F (37.1 C) (11/24 0812) Pulse Rate:  [86-99] 94 (11/24 0812) Resp:  [17-18] 18 (11/24 0812) BP: (104-142)/(44-54) 142/54 (11/24 0812) SpO2:  [93 %] 93 % (11/24 0812) Weight:  [77.6 kg] 77.6 kg (11/24 0500)  Intake/Output from previous day:  Intake/Output Summary (Last 24 hours) at 04/18/2019 1020 Last data filed at 04/18/2019 1007 Gross per 24 hour  Intake 360 ml  Output 600 ml  Net -240 ml    Intake/Output this shift: Total I/O In: 240 [P.O.:240] Out: -   Labs: Recent Labs    04/16/19 0545 04/17/19 0526 04/18/19 0521  HGB 7.5* 7.3* 7.5*   Recent Labs    04/17/19 0526 04/18/19 0521  WBC 9.0 10.6*  RBC 2.36* 2.36*  HCT 20.6* 20.8*  PLT 126* 135*   Recent Labs    04/17/19 0526 04/18/19 0521  NA 136 137  K 3.9 3.9  CL 102 102  CO2 22 25  BUN 27* 28*  CREATININE 1.12* 1.05*  GLUCOSE 138* 122*  CALCIUM 8.6* 8.7*   No results for input(s): LABPT, INR in the last 72 hours.   EXAM General - Patient is Alert, Appropriate and Oriented Extremity - Neurovascular intact Dorsiflexion/Plantar flexion intact Compartment soft Dressing/Incision -clean, dry, no drainage Motor Function - intact, moving foot and toes well on exam. Able to actively extend knee.   Cardiovascular- Regular rate and rhythm, no murmurs/rubs/gallops Respiratory- Lungs clear to auscultation bilaterally Gastrointestinal- soft, nontender and active bowel sounds   Assessment/Plan: 3 Days Post-Op Procedure(s) (LRB): INTRAMEDULLARY (IM) NAIL INTERTROCHANTRIC (Right) Principal Problem:   Syncope and collapse Active Problems:   Hip fracture requiring operative repair, right, closed, initial encounter ([place])   Hypertension   Diabetes mellitus without complication ([place])   Closed intertrochanteric fracture of hip, left, initial encounter ([place])  Estimated body mass index is 29.35 kg/m as calculated from the following:   Height as of this encounter: 5\' 4"  (1.626 m).   Weight as of this encounter: 77.6 kg. Advance diet Up with therapy Plan for discharge will be dependent on patient's progress with PT. It is important for the patient to be safe at home if she is discharged home.  DVT Prophylaxis - Lovenox, Ted hose and SCDs Weight-Bearing as tolerated to right leg  Cassell Smiles, PA-C [facility] 04/18/2019, 10:20 AM

## 2019-04-18 NOTE — Progress Notes (Signed)
Physical Therapy Treatment Patient Details Name: Amy Reid MRN: [id number] DOB: [birth year] Today's Date: 04/18/2019    History of Present Illness From MD notes: Pt is an 85 y.o. female with medical history significant of HTN, Rec TIA, DM II seen in ED for syncope and fall and rt hip fracture.  Pt is s/p closed reduction and short cephalomedullary nail of a R intertrochanteric femur fracture.  MD assessment also includes: post op blood loss anemia, syncope, non-sustained SVT, DM with neuropathy, HTN, and CKD III.    PT Comments    Pt presented with deficits in strength, transfers, mobility, gait, balance, and activity tolerance.  Pt somewhat lethargic this session with occasional cues for increased participation/effort required.  Pt continued to require extensive physical assistance with all bed mobility tasks but stood from the EOB with decreased physical assistance required and with grossly improved stability.  Pt was able to perform low amplitude RLE marches in standing but was unable to lift the L foot from the floor.  Pt will benefit from PT services in a SNF setting upon discharge to safely address above deficits for decreased caregiver assistance and eventual return to PLOF.     Follow Up Recommendations  SNF     Equipment Recommendations  Rolling walker with 5" wheels;3in1 (PT)    Recommendations for Other Services       Precautions / Restrictions Precautions Precautions: Fall Precaution Comments: Blind in R eye Restrictions Weight Bearing Restrictions: Yes RLE Weight Bearing: Weight bearing as tolerated    Mobility  Bed Mobility Overal bed mobility: Needs Assistance Bed Mobility: Rolling;Sidelying to Sit;Sit to Sidelying Rolling: Mod assist Sidelying to sit: Max assist     Sit to sidelying: Max assist General bed mobility comments: Mod verbal and tactile cues for sequencing  Transfers Overall transfer level: Needs assistance Equipment used: Rolling walker  (2 wheeled) Transfers: Sit to/from Stand Sit to Stand: From elevated surface;Min assist         General transfer comment: Mod verbal and tactile cues for sequencing  Ambulation/Gait             General Gait Details: Unable   Stairs             Wheelchair Mobility    Modified Rankin (Stroke Patients Only)       Balance Overall balance assessment: Needs assistance Sitting-balance support: Bilateral upper extremity supported Sitting balance-Leahy Scale: Fair     Standing balance support: Bilateral upper extremity supported Standing balance-Leahy Scale: Poor Standing balance comment: Occasional min A to prevent posterior LOB in standing                            Cognition Arousal/Alertness: Lethargic Behavior During Therapy: WFL for tasks assessed/performed;Flat affect Overall Cognitive Status: Within Functional Limits for tasks assessed                                        Exercises Total Joint Exercises Ankle Circles/Pumps: Strengthening;Both;10 reps;Other reps (comment)(2 x 10 reps with manual resistance) Quad Sets: Strengthening;Both;10 reps;Other reps (comment)(2 x 10 reps) Gluteal Sets: Strengthening;Both;10 reps Towel Squeeze: Strengthening;Both;10 reps Short Arc Quad: Strengthening;Both;10 reps;Other reps (comment)(2 x 10 reps with manual resistance) Heel Slides: AAROM;Both;10 reps Hip ABduction/ADduction: AAROM;Both;10 reps;Other reps (comment)(2 x 10 reps) Straight Leg Raises: AAROM;Both;10 reps;Other reps (comment)(2 x 10 reps) Long Arc Quad:  AROM;Both;10 reps;5 reps Knee Flexion: AROM;Both;10 reps;5 reps Marching in Standing: AROM;Right;5 reps    General Comments        Pertinent Vitals/Pain Pain Assessment: 0-10 Pain Score: 5  Pain Location: R hip Pain Descriptors / Indicators: Aching;Sore Pain Intervention(s): Premedicated before session;Monitored during session    Home Living                       Prior Function            PT Goals (current goals can now be found in the care plan section) Progress towards PT goals: Not progressing toward goals - comment(Pt lethargic and limited by pain this session)    Frequency    BID      PT Plan Current plan remains appropriate    Co-evaluation              AM-PAC PT "6 Clicks" Mobility   Outcome Measure  Help needed turning from your back to your side while in a flat bed without using bedrails?: A Lot Help needed moving from lying on your back to sitting on the side of a flat bed without using bedrails?: A Lot Help needed moving to and from a bed to a chair (including a wheelchair)?: Total Help needed standing up from a chair using your arms (e.g., wheelchair or bedside chair)?: A Lot Help needed to walk in hospital room?: Total Help needed climbing 3-5 steps with a railing? : Total 6 Click Score: 9    End of Session Equipment Utilized During Treatment: Gait belt Activity Tolerance: Patient limited by pain Patient left: in bed;with bed alarm set;with call bell/phone within reach;with family/visitor present;with SCD's reapplied Nurse Communication: Mobility status PT Visit Diagnosis: Unsteadiness on feet (R26.81);History of falling (Z91.81);Other abnormalities of gait and mobility (R26.89);Muscle weakness (generalized) (M62.81)     Time: 6283-1517 PT Time Calculation (min) (ACUTE ONLY): 39 min  Charges:  $Therapeutic Exercise: 23-37 mins $Therapeutic Activity: 8-22 mins                     D. Scott Tegan Burnside PT, DPT 04/18/19, 1:19 PM

## 2019-04-18 NOTE — Progress Notes (Signed)
Physical Therapy Treatment Patient Details Name: Amy Reid MRN: [id number] DOB: 1929-06-13 Today's Date: 04/18/2019    History of Present Illness From MD notes: Pt is an 85 y.o. female with medical history significant of HTN, Rec TIA, DM II seen in ED for syncope and fall and rt hip fracture.  Pt is s/p closed reduction and short cephalomedullary nail of a R intertrochanteric femur fracture.  MD assessment also includes: post op blood loss anemia, syncope, non-sustained SVT, DM with neuropathy, HTN, and CKD III.    PT Comments    Pt presented with deficits in strength, transfers, mobility, gait, balance, and activity tolerance.  Pt somewhat more alert this session but remained drowsy with a flat affect.  Pt continued to present with posterior instability in sitting and standing that improved grossly with anterior weight shifting activities.  Pt was able to lift her R foot from the floor minimally in standing but continued to be unable to lift her L foot from the floor.  While in standing pt started to become increasingly quiet and less responsive to questions and was noted to be staring blankly ahead.  Pt assisted back to supine and quickly returned to baseline.  When asked how she felt while standing pt stated weakly "just tired".  Nursing notified of pt's decreased level of alertness in standing with pt unable to tolerate standing long enough to attempt orthostatic BP assessment.  Pt will benefit from PT services in a SNF setting upon discharge to safely address above deficits for decreased caregiver assistance and eventual return to PLOF.       Follow Up Recommendations  SNF     Equipment Recommendations  Rolling walker with 5" wheels;3in1 (PT)    Recommendations for Other Services       Precautions / Restrictions Precautions Precautions: Fall Precaution Comments: Blind in R eye Restrictions Weight Bearing Restrictions: Yes RLE Weight Bearing: Weight bearing as tolerated     Mobility  Bed Mobility Overal bed mobility: Needs Assistance Bed Mobility: Rolling;Sidelying to Sit;Sit to Sidelying Rolling: Min assist Sidelying to sit: Mod assist     Sit to sidelying: Mod assist General bed mobility comments: Increased patient contribution to bed mobility tasks this session  Transfers Overall transfer level: Needs assistance Equipment used: Rolling walker (2 wheeled) Transfers: Sit to/from Stand Sit to Stand: From elevated surface;Min assist         General transfer comment: Mod verbal and tactile cues for sequencing  Ambulation/Gait             General Gait Details: Unable   Stairs             Wheelchair Mobility    Modified Rankin (Stroke Patients Only)       Balance Overall balance assessment: Needs assistance Sitting-balance support: Bilateral upper extremity supported Sitting balance-Leahy Scale: Fair   Postural control: Posterior lean Standing balance support: Bilateral upper extremity supported Standing balance-Leahy Scale: Poor Standing balance comment: Occasional min A to prevent posterior LOB in standing                            Cognition Arousal/Alertness: Lethargic Behavior During Therapy: WFL for tasks assessed/performed;Flat affect Overall Cognitive Status: Within Functional Limits for tasks assessed  Exercises Total Joint Exercises Ankle Circles/Pumps: Strengthening;Both;10 reps;Other reps (comment)(2x10 reps with manual resistance) Quad Sets: Strengthening;Both;10 reps;Other reps (comment)(2x10 reps) Gluteal Sets: Strengthening;Both;10 reps Towel Squeeze: Strengthening;Both;10 reps Short Arc Quad: Strengthening;Both;10 reps;Other reps (comment)(2x10 reps with manual resistance) Heel Slides: AAROM;Both;10 reps;5 reps Hip ABduction/ADduction: AAROM;Both;10 reps;Other reps (comment)(2x10 reps) Straight Leg Raises: AAROM;Both;10 reps;Other  reps (comment)(2x10 reps) Long Arc Quad: AROM;Both;10 reps;5 reps Knee Flexion: AROM;Both;10 reps;5 reps Marching in Standing: AROM;Right;5 reps  Anterior weight shifting in sitting and standing to address posterior instability     General Comments        Pertinent Vitals/Pain Pain Assessment: 0-10 Pain Score: 5  Pain Location: R hip Pain Descriptors / Indicators: Aching;Sore Pain Intervention(s): Premedicated before session;Monitored during session    Home Living                      Prior Function            PT Goals (current goals can now be found in the care plan section) Progress towards PT goals: Progressing toward goals    Frequency    BID      PT Plan Current plan remains appropriate    Co-evaluation              AM-PAC PT "6 Clicks" Mobility   Outcome Measure  Help needed turning from your back to your side while in a flat bed without using bedrails?: A Lot Help needed moving from lying on your back to sitting on the side of a flat bed without using bedrails?: A Little Help needed moving to and from a bed to a chair (including a wheelchair)?: A Lot Help needed standing up from a chair using your arms (e.g., wheelchair or bedside chair)?: A Little Help needed to walk in hospital room?: Total Help needed climbing 3-5 steps with a railing? : Total 6 Click Score: 12    End of Session Equipment Utilized During Treatment: Gait belt Activity Tolerance: Patient limited by pain;Patient limited by fatigue Patient left: in bed;with call bell/phone within reach;with family/visitor present;with SCD's reapplied(No bed alarm per family request while family in room, nursing aware) Nurse Communication: Mobility status PT Visit Diagnosis: Unsteadiness on feet (R26.81);History of falling (Z91.81);Other abnormalities of gait and mobility (R26.89);Muscle weakness (generalized) (M62.81)     Time: 5643-3295 PT Time Calculation (min) (ACUTE ONLY): 40  min  Charges:  $Therapeutic Exercise: 23-37 mins $Therapeutic Activity: 8-22 mins                     D. Scott Shermaine Brigham PT, DPT 04/18/19, 4:12 PM

## 2019-04-19 ENCOUNTER — Inpatient Hospital Stay
Admit: 2019-04-19 | Discharge: 2019-04-19 | Disposition: A | Discharge status: Home / Self Care | Payer: Medicare Other | Attending: Internal Medicine | Admitting: Internal Medicine

## 2019-04-19 DIAGNOSIS — E1169 Type 2 diabetes mellitus with other specified complication: Secondary | ICD-10-CM

## 2019-04-19 DIAGNOSIS — E1121 Type 2 diabetes mellitus with diabetic nephropathy: Secondary | ICD-10-CM

## 2019-04-19 DIAGNOSIS — E785 Hyperlipidemia, unspecified: Secondary | ICD-10-CM

## 2019-04-19 DIAGNOSIS — N1831 Chronic kidney disease, stage 3a: Secondary | ICD-10-CM

## 2019-04-19 DIAGNOSIS — E1122 Type 2 diabetes mellitus with diabetic chronic kidney disease: Secondary | ICD-10-CM

## 2019-04-19 DIAGNOSIS — E1142 Type 2 diabetes mellitus with diabetic polyneuropathy: Secondary | ICD-10-CM

## 2019-04-19 DIAGNOSIS — S72001D Fracture of unspecified part of neck of right femur, subsequent encounter for closed fracture with routine healing: Secondary | ICD-10-CM

## 2019-04-19 DIAGNOSIS — I471 Supraventricular tachycardia, unspecified: Secondary | ICD-10-CM

## 2019-04-19 DIAGNOSIS — R55 Syncope and collapse: Secondary | ICD-10-CM

## 2019-04-19 LAB — CBC
HCT: 20.5 % — ABNORMAL LOW (ref 36.0–46.0)
Hemoglobin: 7.1 g/dL — ABNORMAL LOW (ref 12.0–15.0)
MCH: 30.9 pg (ref 26.0–34.0)
MCHC: 34.6 g/dL (ref 30.0–36.0)
MCV: 89.1 fL (ref 80.0–100.0)
Platelets: 170 10*3/uL (ref 150–400)
RBC: 2.3 MIL/uL — ABNORMAL LOW (ref 3.87–5.11)
RDW: 15.9 % — ABNORMAL HIGH (ref 11.5–15.5)
WBC: 10.6 10*3/uL — ABNORMAL HIGH (ref 4.0–10.5)
nRBC: 0.6 % — ABNORMAL HIGH (ref 0.0–0.2)

## 2019-04-19 LAB — BASIC METABOLIC PANEL
Anion gap: 11 (ref 5–15)
BUN: 32 mg/dL — ABNORMAL HIGH (ref 8–23)
CO2: 26 mmol/L (ref 22–32)
Calcium: 8.5 mg/dL — ABNORMAL LOW (ref 8.9–10.3)
Chloride: 100 mmol/L (ref 98–111)
Creatinine, Ser: 1.1 mg/dL — ABNORMAL HIGH (ref 0.44–1.00)
GFR calc Af Amer: 52 mL/min — ABNORMAL LOW (ref >60)
GFR calc non Af Amer: 45 mL/min — ABNORMAL LOW (ref >60)
Glucose, Bld: 140 mg/dL — ABNORMAL HIGH (ref 70–99)
Potassium: 4 mmol/L (ref 3.5–5.1)
Sodium: 137 mmol/L (ref 135–145)

## 2019-04-19 LAB — GLUCOSE, CAPILLARY
Glucose-Capillary: 135 mg/dL — ABNORMAL HIGH (ref 70–99)
Glucose-Capillary: 130 mg/dL — ABNORMAL HIGH (ref 70–99)
Glucose-Capillary: 136 mg/dL — ABNORMAL HIGH (ref 70–99)
Glucose-Capillary: 123 mg/dL — ABNORMAL HIGH (ref 70–99)
Glucose-Capillary: 143 mg/dL — ABNORMAL HIGH (ref 70–99)

## 2019-04-19 LAB — ABO/RH: ABO/RH(D): A POS

## 2019-04-19 LAB — PREPARE RBC (CROSSMATCH)

## 2019-04-19 MED ORDER — FUROSEMIDE 10 MG/ML IJ SOLN
20.0000 mg | Freq: Once | INTRAMUSCULAR | Status: AC
  Administered 2019-04-19: 16:00:00 20 mg via INTRAVENOUS
  Filled 2019-04-19: qty 4

## 2019-04-19 MED ORDER — ENOXAPARIN SODIUM 40 MG/0.4ML ~~LOC~~ SOLN: 40.0000 mg | SUBCUTANEOUS | 0 refills | Status: AC

## 2019-04-19 MED ORDER — DOCUSATE SODIUM 100 MG PO CAPS: 100.0000 mg | ORAL_CAPSULE | Freq: Every day | ORAL | 0 refills | Status: AC

## 2019-04-19 MED ORDER — ACETAMINOPHEN 325 MG PO TABS
650.0000 mg | ORAL_TABLET | Freq: Once | ORAL | Status: AC
  Administered 2019-04-19: 650 mg via ORAL
  Filled 2019-04-19: qty 2

## 2019-04-19 MED ORDER — ACETAMINOPHEN 325 MG PO TABS: 650.0000 mg | ORAL_TABLET | ORAL | 2 refills | Status: AC | PRN

## 2019-04-19 MED ORDER — SODIUM CHLORIDE 0.9% IV SOLUTION
Freq: Once | INTRAVENOUS | Status: AC
  Administered 2019-04-19: 17:00:00 via INTRAVENOUS

## 2019-04-19 MED ORDER — FERROUS SULFATE 325 (65 FE) MG PO TABS: 325.0000 mg | ORAL_TABLET | Freq: Every day | ORAL | 0 refills | Status: AC

## 2019-04-19 MED ORDER — FLEET ENEMA 7-19 GM/118ML RE ENEM
1.0000 | ENEMA | Freq: Once | RECTAL | Status: AC
  Administered 2019-04-19: 16:00:00 1 via RECTAL

## 2019-04-19 MED ORDER — OXYCODONE HCL 5 MG PO TABS: 5.0000 mg | ORAL_TABLET | Freq: Four times a day (QID) | ORAL | 0 refills | Status: AC | PRN

## 2019-04-19 MED ORDER — SODIUM CHLORIDE 0.9 % IV SOLN
300.0000 mg | Freq: Once | INTRAVENOUS | Status: AC
  Administered 2019-04-19: 300 mg via INTRAVENOUS
  Filled 2019-04-19: qty 15

## 2019-04-19 MED ORDER — METOPROLOL SUCCINATE ER 25 MG PO TB24: 12.5000 mg | ORAL_TABLET | Freq: Every day | ORAL | 0 refills | Status: DC

## 2019-04-19 NOTE — TOC Transition Note (Signed)
Transition of Care [facility]) - CM/SW Discharge Note   Patient Details  Name: Amy Reid MRN: [id number] Date of Birth: 10/11/1929  [facility]) CM/SW Contact:  Su Hilt, RN Phone Number: 04/19/2019, 10:21 AM   Clinical Narrative:    Spoke with the son and the patient at length, the patient is to DC today to [facility] via EMS Transport The bedside nurse to call report to [facility] and call EMS when ready to transport   Final next level of care: Skilled Nursing Facility Barriers to Discharge: Barriers Resolved   Patient Goals and CMS Choice Patient states their goals for this hospitalization and ongoing recovery are:: get home      Discharge Placement                       Discharge Plan and Services                                     Social Determinants of Health (SDOH) Interventions     Readmission Risk Interventions No flowsheet data found.

## 2019-04-19 NOTE — Progress Notes (Signed)
Physical Therapy Treatment Patient Details Name: Amy Reid MRN: [id number] DOB: Oct 22, 1929 Today's Date: 04/19/2019    History of Present Illness From MD notes: Pt is an 85 y.o. female with medical history significant of HTN, Rec TIA, DM II seen in ED for syncope and fall and rt hip fracture.  Pt is s/p closed reduction and short cephalomedullary nail of a R intertrochanteric femur fracture.  MD assessment also includes: post op blood loss anemia, syncope, non-sustained SVT, DM with neuropathy, HTN, and CKD III.    PT Comments    Pt presented with deficits in strength, transfers, mobility, gait, balance, and activity tolerance.  Pt somewhat less lethargic this session but overall affect remained flat with extra cues for following commands during the session. Upon standing the pt was able to lift the LLE several times with heavy lean on the RW but upon amb attempt the pt began to become less alert and responsive to cues/commands and returned to sitting.  Pt reported feeling better upon returning to supine with BP taken at 124/56 and SpO2/HR WNL.  Pt will benefit from PT services in a SNF setting upon discharge to safely address above deficits for decreased caregiver assistance and eventual return to PLOF.     Follow Up Recommendations  SNF     Equipment Recommendations  Rolling walker with 5" wheels;3in1 (PT)    Recommendations for Other Services       Precautions / Restrictions Precautions Precautions: Fall Precaution Comments: Blind in R eye Restrictions Weight Bearing Restrictions: Yes RLE Weight Bearing: Weight bearing as tolerated    Mobility  Bed Mobility Overal bed mobility: Needs Assistance Bed Mobility: Rolling;Sidelying to Sit;Sit to Sidelying Rolling: Min assist Sidelying to sit: Mod assist     Sit to sidelying: Mod assist General bed mobility comments: Mod verbal and tactile cues for sequencing  Transfers Overall transfer level: Needs  assistance Equipment used: Rolling walker (2 wheeled) Transfers: Sit to/from Stand Sit to Stand: From elevated surface;Min assist         General transfer comment: Mod verbal and tactile cues for sequencing  Ambulation/Gait             General Gait Details: Unable   Stairs             Wheelchair Mobility    Modified Rankin (Stroke Patients Only)       Balance Overall balance assessment: Needs assistance Sitting-balance support: Bilateral upper extremity supported Sitting balance-Leahy Scale: Fair     Standing balance support: Bilateral upper extremity supported Standing balance-Leahy Scale: Poor Standing balance comment: Occasional min A to prevent posterior LOB in standing                            Cognition Arousal/Alertness: Lethargic Behavior During Therapy: WFL for tasks assessed/performed;Flat affect Overall Cognitive Status: Within Functional Limits for tasks assessed                                        Exercises Total Joint Exercises Ankle Circles/Pumps: Strengthening;Both;10 reps;Other reps (comment)(2x10 reps with manual resistance) Quad Sets: Strengthening;Both;10 reps;Other reps (comment)(2x10 reps) Gluteal Sets: Strengthening;Both;10 reps;Other reps (comment)(2x10 reps) Towel Squeeze: Strengthening;Both;10 reps Short Arc Quad: Strengthening;Both;10 reps;Other reps (comment)(2x10 reps) Heel Slides: AAROM;Both;10 reps;5 reps Hip ABduction/ADduction: AAROM;Both;10 reps;Other reps (comment)(2x10 reps) Straight Leg Raises: AAROM;Both;10 reps;Other reps (comment)(2x10 reps) Long  Arc Quad: AROM;Both;10 reps;5 reps Knee Flexion: AROM;Both;10 reps;5 reps Marching in Standing: AROM;Right;5 reps Other Exercises Other Exercises: LLE marching in standing x 3 Other Exercises: Bilateral supine leg press with gentle manual resistance x 10    General Comments        Pertinent Vitals/Pain Pain Assessment: 0-10 Pain  Score: 4  Pain Location: R hip Pain Descriptors / Indicators: Aching;Sore Pain Intervention(s): Premedicated before session;Monitored during session    Home Living                      Prior Function            PT Goals (current goals can now be found in the care plan section) Progress towards PT goals: Progressing toward goals    Frequency    BID      PT Plan Current plan remains appropriate    Co-evaluation              AM-PAC PT "6 Clicks" Mobility   Outcome Measure  Help needed turning from your back to your side while in a flat bed without using bedrails?: A Lot Help needed moving from lying on your back to sitting on the side of a flat bed without using bedrails?: A Little Help needed moving to and from a bed to a chair (including a wheelchair)?: A Lot Help needed standing up from a chair using your arms (e.g., wheelchair or bedside chair)?: A Little Help needed to walk in hospital room?: Total Help needed climbing 3-5 steps with a railing? : Total 6 Click Score: 12    End of Session Equipment Utilized During Treatment: Gait belt Activity Tolerance: Patient limited by fatigue;Patient limited by pain Patient left: in bed;with call bell/phone within reach;with family/visitor present;with SCD's reapplied Nurse Communication: Mobility status PT Visit Diagnosis: Unsteadiness on feet (R26.81);History of falling (Z91.81);Other abnormalities of gait and mobility (R26.89);Muscle weakness (generalized) (M62.81)     Time: 6712-4580 PT Time Calculation (min) (ACUTE ONLY): 39 min  Charges:  $Therapeutic Exercise: 23-37 mins $Therapeutic Activity: 8-22 mins                     D. Scott Yatzari Jonsson PT, DPT 04/19/19, 1:06 PM

## 2019-04-19 NOTE — Progress Notes (Signed)
PT Cancellation Note  Patient Details Name: Amy Reid MRN: [id number] DOB: 03-24-1930   Cancelled Treatment:    Reason Eval/Treat Not Completed: Other (comment): Pt scheduled for transfusion, will hold PT services this date and attempt to see pt at a future date/time as medically appropriate.     Linus Salmons PT, DPT 04/19/19, 4:18 PM

## 2019-04-19 NOTE — TOC Progression Note (Signed)
Transition of Care [facility]) - Progression Note    Patient Details  Name: Amy Reid MRN: [id number] Date of Birth: 11-03-1929  Transition of Care [facility]) CM/SW Contact  Su Hilt, RN Phone Number: 04/19/2019, 9:33 AM  Clinical Narrative:     [facility] and obtained approval, start date 11/25 to go to [facility] number is [id number] Ref number [id number] Good for 3 days and coordinator is [employer] Notified Amy Reid at [facility] of this information and called the son Amy Reid and let him know the approval was obtained  Expected Discharge Plan: [place] Barriers to Discharge: Continued Medical Work up  Expected Discharge Plan and Services Expected Discharge Plan: [place]                                               Social Determinants of Health (SDOH) Interventions    Readmission Risk Interventions No flowsheet data found.

## 2019-04-19 NOTE — Discharge Summary (Signed)
[facility] - [place] [facility]   PATIENT NAME: Amy Reid    MR#:  [id number]  DATE OF BIRTH:  December 05, 1929  DATE OF ADMISSION:  04/15/2019 ADMITTING PHYSICIAN: Gertha Calkin, MD  DATE OF DISCHARGE: 04/19/2019  PRIMARY CARE PHYSICIAN: Marina Goodell, MD    ADMISSION DIAGNOSIS:  Syncope and collapse [R55] Closed intertrochanteric fracture of hip, left, initial encounter [facility]) [S72.142A] Intertrochanteric fracture of right hip, closed, initial encounter (HCC) [S72.141A]  DISCHARGE DIAGNOSIS:  Principal Problem:   Syncope and collapse Active Problems:   Hip fracture requiring operative repair, right, closed, initial encounter (HCC)   Hypertension   Diabetes mellitus without complication (HCC)   Closed intertrochanteric fracture of hip, left, initial encounter (HCC)   SECONDARY DIAGNOSIS:   Past Medical History:  Diagnosis Date  . Diabetes mellitus without complication (HCC)   . Hypertension     HOSPITAL COURSE:   1.  Right hip fracture requiring operative repair.  Status post ORIF on 04/15/19.  Weightbearing as tolerated.  Pain control Short course of pain medications.  Convert to Tylenol as quickly as possible. 2.  Postoperative anemia.  Hemoglobin 7.1.  Check CB weekly while on Lovenox.  We will give IV Venofer today.  Son deferred blood transfusion at this time. 3.  Vasovagal syncope.  Recommend checking orthostatic vital signs as outpatient once more ambulatory. 4.  Essential hypertension on lisinopril HCT 5.  Type 2 diabetes mellitus with neuropathy, chronic kidney disease stage IIIa, hyperlipidemia.  Restart Actos.  Check fingersticks daily and anytime she feels funny.  Continue lovastatin. 6.  SVT nonsustained.  Low-dose metoprolol at night.  DISCHARGE CONDITIONS:   Satisfactory  CONSULTS OBTAINED:  Treatment Team:  Donato Heinz, MD  DRUG ALLERGIES:   Allergies  Allergen Reactions  . Epinephrine Other (See Comments)    coma     DISCHARGE MEDICATIONS:   Allergies as of 04/19/2019      Reactions   Epinephrine Other (See Comments)   coma      Medication List    TAKE these medications   acetaminophen 325 MG tablet Commonly known as: Tylenol Take 2 tablets (650 mg total) by mouth every 4 (four) hours as needed.   aspirin EC 81 MG tablet Take 81 mg by mouth every Monday, Wednesday, and Friday.   Biotin 10 MG Caps Take 10 mg by mouth daily.   cetirizine 10 MG tablet Commonly known as: ZYRTEC Take 10 mg by mouth daily as needed for allergies.   docusate sodium 100 MG capsule Commonly known as: COLACE Take 1 capsule (100 mg total) by mouth daily. Start taking on: April 20, 2019   enoxaparin 40 MG/0.4ML injection Commonly known as: LOVENOX Inject 0.4 mLs (40 mg total) into the skin daily for 14 days. Start taking on: April 20, 2019   ferrous sulfate 325 (65 FE) MG tablet Take 1 tablet (325 mg total) by mouth daily with breakfast. Start taking on: April 20, 2019   gabapentin 100 MG capsule Commonly known as: NEURONTIN Take 100 mg by mouth at bedtime.   latanoprost 0.005 % ophthalmic solution Commonly known as: XALATAN Place 1 drop into both eyes at bedtime.   lisinopril-hydrochlorothiazide 20-12.5 MG tablet Commonly known as: ZESTORETIC Take 1 tablet by mouth at bedtime.   lovastatin 20 MG tablet Commonly known as: MEVACOR Take 20 mg by mouth at bedtime.   magnesium oxide 400 MG tablet Commonly known as: MAG-OX Take 400 mg by mouth daily.  metoprolol succinate 25 MG 24 hr tablet Commonly known as: Toprol XL Take 0.5 tablets (12.5 mg total) by mouth at bedtime.   Multi-Vitamin tablet Take 1 tablet by mouth daily.   oxyCODONE 5 MG immediate release tablet Commonly known as: Oxy IR/ROXICODONE Take 1 tablet (5 mg total) by mouth every 6 (six) hours as needed for moderate pain or severe pain.   pioglitazone 15 MG tablet Commonly known as: ACTOS Take 15 mg by mouth  daily.   vitamin B-12 1000 MCG tablet Commonly known as: CYANOCOBALAMIN Take 1,000 mcg by mouth daily.        DISCHARGE INSTRUCTIONS:   Follow-up Dr. At rehab 1 day Follow-up orthopedics 2 weeks  If you experience worsening of your admission symptoms, develop shortness of breath, life threatening emergency, suicidal or homicidal thoughts you must seek medical attention immediately by calling 911 or calling your MD immediately  if symptoms less severe.  You Must read complete instructions/literature along with all the possible adverse reactions/side effects for all the Medicines you take and that have been prescribed to you. Take any new Medicines after you have completely understood and accept all the possible adverse reactions/side effects.   Please note  You were cared for by a hospitalist during your hospital stay. If you have any questions about your discharge medications or the care you received while you were in the hospital after you are discharged, you can call the unit and asked to speak with the hospitalist on call if the hospitalist that took care of you is not available. Once you are discharged, your primary care physician will handle any further medical issues. Please note that NO REFILLS for any discharge medications will be authorized once you are discharged, as it is imperative that you return to your primary care physician (or establish a relationship with a primary care physician if you do not have one) for your aftercare needs so that they can reassess your need for medications and monitor your lab values.    Today   CHIEF COMPLAINT:   Chief Complaint  Patient presents with  . Fall  . Loss of Consciousness    HISTORY OF PRESENT ILLNESS:  Amy Reid  is a 85 y.o. female presented with a fall and found to have a hip fracture   VITAL SIGNS:  Blood pressure (!) 141/61, pulse 98, temperature 98.5 F (36.9 C), temperature source Oral, resp. rate 16, height  5\' 4"  (1.626 m), weight 77.6 kg, SpO2 94 %.  I/O:    Intake/Output Summary (Last 24 hours) at 04/19/2019 1024 Last data filed at 04/19/2019 0646 Gross per 24 hour  Intake -  Output 350 ml  Net -350 ml    PHYSICAL EXAMINATION:  GENERAL:  85 y.o.-year-old patient lying in the bed with no acute distress.  EYES: Pupils equal, round, reactive to light and accommodation. No scleral icterus. Extraocular muscles intact.  HEENT: Head atraumatic, normocephalic. Oropharynx and nasopharynx clear.  NECK:  Supple, no jugular venous distention. No thyroid enlargement, no tenderness.  LUNGS: Normal breath sounds bilaterally, no wheezing, rales,rhonchi or crepitation. No use of accessory muscles of respiration.  CARDIOVASCULAR: S1, S2 normal. No murmurs, rubs, or gallops.  ABDOMEN: Soft, non-tender, non-distended. Bowel sounds present. No organomegaly or mass.  EXTREMITIES: No pedal edema, cyanosis, or clubbing.  NEUROLOGIC: Cranial nerves II through XII are intact. Muscle strength 5/5 in all extremities. Sensation intact. Gait not checked.  Barely able to straight leg raise bilaterally PSYCHIATRIC: The patient is alert  and oriented x 3.  SKIN: No obvious rash, lesion, or ulcer.   DATA REVIEW:   CBC Recent Labs  Lab 04/19/19 0511  WBC 10.6*  HGB 7.1*  HCT 20.5*  PLT 170    Chemistries  Recent Labs  Lab 04/15/19 0638  04/19/19 0511  NA 138   < > 137  K 3.5   < > 4.0  CL 104   < > 100  CO2 24   < > 26  GLUCOSE 164*   < > 140*  BUN 39*   < > 32*  CREATININE 1.39*   < > 1.10*  CALCIUM 9.4   < > 8.5*  AST 24  --   --   ALT 18  --   --   ALKPHOS 42  --   --   BILITOT 0.7  --   --    < > = values in this interval not displayed.     Microbiology Results  Results for orders placed or performed during the hospital encounter of 04/15/19  SARS Coronavirus 2 by RT PCR (hospital order, performed in [facility] hospital lab) Nasopharyngeal Nasopharyngeal Swab     Status: None    Collection Time: 04/15/19  8:09 AM   Specimen: Nasopharyngeal Swab  Result Value Ref Range Status   SARS Coronavirus 2 NEGATIVE NEGATIVE Final    Comment: (NOTE) If result is NEGATIVE SARS-CoV-2 target nucleic acids are NOT DETECTED. The SARS-CoV-2 RNA is generally detectable in upper and lower  respiratory specimens during the acute phase of infection. The lowest  concentration of SARS-CoV-2 viral copies this assay can detect is 250  copies / mL. A negative result does not preclude SARS-CoV-2 infection  and should not be used as the sole basis for treatment or other  patient management decisions.  A negative result may occur with  improper specimen collection / handling, submission of specimen other  than nasopharyngeal swab, presence of viral mutation(s) within the  areas targeted by this assay, and inadequate number of viral copies  (<250 copies / mL). A negative result must be combined with clinical  observations, patient history, and epidemiological information. If result is POSITIVE SARS-CoV-2 target nucleic acids are DETECTED. The SARS-CoV-2 RNA is generally detectable in upper and lower  respiratory specimens dur ing the acute phase of infection.  Positive  results are indicative of active infection with SARS-CoV-2.  Clinical  correlation with patient history and other diagnostic information is  necessary to determine patient infection status.  Positive results do  not rule out bacterial infection or co-infection with other viruses. If result is PRESUMPTIVE POSTIVE SARS-CoV-2 nucleic acids MAY BE PRESENT.   A presumptive positive result was obtained on the submitted specimen  and confirmed on repeat testing.  While 2019 novel coronavirus  (SARS-CoV-2) nucleic acids may be present in the submitted sample  additional confirmatory testing may be necessary for epidemiological  and / or clinical management purposes  to differentiate between  SARS-CoV-2 and other Sarbecovirus  currently known to infect humans.  If clinically indicated additional testing with an alternate test  methodology [phone]) is advised. The SARS-CoV-2 RNA is generally  detectable in upper and lower respiratory sp ecimens during the acute  phase of infection. The expected result is Negative. Fact Sheet for Patients:  StrictlyIdeas.no Fact Sheet for Healthcare Providers: BankingDealers.co.za This test is not yet approved or cleared by the [place] FDA and has been authorized for detection and/or diagnosis of SARS-CoV-2 by FDA  under an Emergency Use Authorization (EUA).  This EUA will remain in effect (meaning this test can be used) for the duration of the COVID-19 declaration under Section 564(b)(1) of the Act, 21 U.S.C. section 360bbb-3(b)(1), unless the authorization is terminated or revoked sooner. Performed at [facility], [address] [place], [place] [address]       Management plans discussed with the patient, family and they are in agreement.  CODE STATUS:     Code Status Orders  (From admission, onward)         Start     Ordered   04/15/19 0923  Full code  Continuous     04/15/19 0926        Code Status History    Date Active Date Inactive Code Status Order ID Comments User Context   04/15/2019 0926 04/15/2019 0926 Full Code [id number]  Gertha Calkin, MD ED   Advance Care Planning Activity    Advance Directive Documentation     Most Recent Value  Type of Advance Directive  Healthcare Power of Attorney  Pre-existing out of facility DNR order (yellow form or pink MOST form)  -  "MOST" Form in Place?  -      TOTAL TIME TAKING CARE OF THIS PATIENT: 35 minutes.    Alford Highland M.D on 04/19/2019 at 10:24 AM  Between 7am to 6pm - Pager - [phone]  After 6pm go to [email or web address] - password [email or web address]  [facility]  CC: Primary care physician; Marina Goodell, MD

## 2019-04-19 NOTE — Progress Notes (Signed)
Patient ID: Amy Reid, female   DOB: 1930-01-23, 85 y.o.   MRN: [id number]  Came back to see patient.  Patient had an episode where she felt very weak.  She is unable to describe the situation very well.  No chest pain or shortness of breath.  Spoke with another family member on the phone and they are concerned that over the past 2 months that she has been feeling very weak and they concerned that we never found a reason why she fell in the first place.  I will cancel discharge today.  Transfuse 1 unit of packed red blood cells over 4 hours.  Benefits and risks of blood transfusion explained to patient and son earlier in the day. Will place back on telemetry and monitor for arrhythmia.  Continue the beta-blocker at night.  Check an echocardiogram.  Dr Loletha Grayer

## 2019-04-19 NOTE — Plan of Care (Signed)
Dressing changed at bedside. Incision clean and dry with no erythema, drainage, or signs of infection. Patient's son given 2 honeycomb dressings for after discharge.

## 2019-04-19 NOTE — Progress Notes (Addendum)
  Subjective: 4 Days Post-Op Procedure(s) (LRB): INTRAMEDULLARY (IM) NAIL INTERTROCHANTRIC (Right) Patient reports pain as well-controlled.   Patient is well, and has had no acute complaints or problems Plan is to go Skilled nursing facility after hospital stay. Negative for chest pain and shortness of breath Fever: no Gastrointestinal: negative for nausea and vomiting.  Patient has not had a bowel movement.  Objective: Vital signs in last 24 hours: Temp:  [98.1 F (36.7 C)-98.7 F (37.1 C)] 98.5 F (36.9 C) (11/24 2333) Pulse Rate:  [87-94] 93 (11/24 2333) Resp:  [16-18] 16 (11/24 2333) BP: (108-142)/(53-57) 108/57 (11/24 2333) SpO2:  [92 %-97 %] 92 % (11/24 2333)  Intake/Output from previous day:  Intake/Output Summary (Last 24 hours) at 04/19/2019 0722 Last data filed at 04/19/2019 0646 Gross per 24 hour  Intake 240 ml  Output 350 ml  Net -110 ml    Intake/Output this shift: No intake/output data recorded.  Labs: Recent Labs    04/17/19 0526 04/18/19 0521 04/19/19 0511  HGB 7.3* 7.5* 7.1*   Recent Labs    04/18/19 0521 04/19/19 0511  WBC 10.6* 10.6*  RBC 2.36* 2.30*  HCT 20.8* 20.5*  PLT 135* 170   Recent Labs    04/18/19 0521 04/19/19 0511  NA 137 137  K 3.9 4.0  CL 102 100  CO2 25 26  BUN 28* 32*  CREATININE 1.05* 1.10*  GLUCOSE 122* 140*  CALCIUM 8.7* 8.5*   No results for input(s): LABPT, INR in the last 72 hours.   EXAM General - Patient is Alert, Appropriate and Oriented Extremity - Neurovascular intact Dorsiflexion/Plantar flexion intact Compartment soft Dressing/Incision -slight serosanguinous drainage noted over the proximal dressing. Motor Function - intact, moving foot and toes well on exam.  Cardiovascular- Regular rate and rhythm, no murmurs/rubs/gallops Respiratory- Lungs clear to auscultation bilaterally Gastrointestinal- soft, nontender and active bowel sounds   Assessment/Plan: 4 Days Post-Op Procedure(s)  (LRB): INTRAMEDULLARY (IM) NAIL INTERTROCHANTRIC (Right) Principal Problem:   Syncope and collapse Active Problems:   Hip fracture requiring operative repair, right, closed, initial encounter ([place])   Hypertension   Diabetes mellitus without complication ([place])   Closed intertrochanteric fracture of hip, left, initial encounter ([place])  Estimated body mass index is 29.35 kg/m as calculated from the following:   Height as of this encounter: 5\' 4"  (1.626 m).   Weight as of this encounter: 77.6 kg. Advance diet Up with therapy Discharge to SNF    DVT Prophylaxis - Lovenox, Ted hose and SCDs Weight-Bearing as tolerated to right leg  Cassell Smiles, PA-C [facility] Orthopaedic Surgery 04/19/2019, 7:22 AM

## 2019-04-20 DIAGNOSIS — S72001S Fracture of unspecified part of neck of right femur, sequela: Secondary | ICD-10-CM

## 2019-04-20 DIAGNOSIS — E1142 Type 2 diabetes mellitus with diabetic polyneuropathy: Secondary | ICD-10-CM

## 2019-04-20 DIAGNOSIS — I951 Orthostatic hypotension: Secondary | ICD-10-CM

## 2019-04-20 LAB — BPAM RBC
Blood Product Expiration Date: 202012232359
ISSUE DATE / TIME: 202011251848
Unit Type and Rh: 6200

## 2019-04-20 LAB — BASIC METABOLIC PANEL
Anion gap: 9 (ref 5–15)
BUN: 34 mg/dL — ABNORMAL HIGH (ref 8–23)
CO2: 26 mmol/L (ref 22–32)
Calcium: 8.6 mg/dL — ABNORMAL LOW (ref 8.9–10.3)
Chloride: 103 mmol/L (ref 98–111)
Creatinine, Ser: 1.18 mg/dL — ABNORMAL HIGH (ref 0.44–1.00)
GFR calc Af Amer: 48 mL/min — ABNORMAL LOW (ref >60)
GFR calc non Af Amer: 41 mL/min — ABNORMAL LOW (ref >60)
Glucose, Bld: 140 mg/dL — ABNORMAL HIGH (ref 70–99)
Potassium: 3.8 mmol/L (ref 3.5–5.1)
Sodium: 138 mmol/L (ref 135–145)

## 2019-04-20 LAB — TYPE AND SCREEN
ABO/RH(D): A POS
Antibody Screen: NEGATIVE
Unit division: 0

## 2019-04-20 LAB — CBC
HCT: 26.3 % — ABNORMAL LOW (ref 36.0–46.0)
Hemoglobin: 9 g/dL — ABNORMAL LOW (ref 12.0–15.0)
MCH: 31.4 pg (ref 26.0–34.0)
MCHC: 34.2 g/dL (ref 30.0–36.0)
MCV: 91.6 fL (ref 80.0–100.0)
Platelets: 191 10*3/uL (ref 150–400)
RBC: 2.87 MIL/uL — ABNORMAL LOW (ref 3.87–5.11)
RDW: 15.4 % (ref 11.5–15.5)
WBC: 11.1 10*3/uL — ABNORMAL HIGH (ref 4.0–10.5)
nRBC: 0.7 % — ABNORMAL HIGH (ref 0.0–0.2)

## 2019-04-20 LAB — ECHOCARDIOGRAM COMPLETE
Height: 64 in
Weight: 2736 oz

## 2019-04-20 LAB — GLUCOSE, CAPILLARY
Glucose-Capillary: 124 mg/dL — ABNORMAL HIGH (ref 70–99)
Glucose-Capillary: 167 mg/dL — ABNORMAL HIGH (ref 70–99)
Glucose-Capillary: 147 mg/dL — ABNORMAL HIGH (ref 70–99)
Glucose-Capillary: 145 mg/dL — ABNORMAL HIGH (ref 70–99)

## 2019-04-20 LAB — TSH: TSH: 3.156 u[IU]/mL (ref 0.350–4.500)

## 2019-04-20 LAB — VITAMIN B12: Vitamin B-12: 2339 pg/mL — ABNORMAL HIGH (ref 180–914)

## 2019-04-20 NOTE — Progress Notes (Signed)
Patient ID: Amy Reid, female   DOB: [birth year], 85 y.o.   MRN: [id number] [facility] PROGRESS NOTE  ROSEANA RHINE TKZ:[id number] DOB: 05-May-1930 DOA: 04/15/2019 PCP: Sofie Hartigan, MD  HPI/Subjective: Patient feeling better today.  Received a blood transfusion yesterday.  Yesterday afternoon had an episode where she did not feel well and family was very concerned.  This morning she was found to be orthostatic with blood pressure dropping down quite a bit.  Felt better yesterday after a bowel movement.   Objective: Vitals:   04/20/19 0616 04/20/19 0817  BP: (!) 171/65 (!) 153/63  Pulse: 86 89  Resp: 14   Temp: 97.7 F (36.5 C) 98.7 F (37.1 C)  SpO2: 94% 96%    Intake/Output Summary (Last 24 hours) at 04/20/2019 1144 Last data filed at 04/20/2019 1139 Gross per 24 hour  Intake 570 ml  Output 1300 ml  Net -730 ml   Filed Weights   04/15/19 0635 04/18/19 0500 04/20/19 0616  Weight: 56.7 kg 77.6 kg 77.2 kg    ROS: Review of Systems  Constitutional: Negative for chills and fever.  Eyes: Negative for blurred vision.  Respiratory: Negative for cough and shortness of breath.   Cardiovascular: Negative for chest pain.  Gastrointestinal: Negative for abdominal pain, constipation, diarrhea, nausea and vomiting.  Genitourinary: Negative for dysuria.  Musculoskeletal: Positive for joint pain.  Neurological: Negative for dizziness and headaches.   Exam: Physical Exam  HENT:  Nose: No mucosal edema.  Mouth/Throat: No oropharyngeal exudate or posterior oropharyngeal edema.  Eyes: Pupils are equal, round, and reactive to light. Conjunctivae, EOM and lids are normal.  Neck: No JVD present. Carotid bruit is not present. No edema present. No thyroid mass and no thyromegaly present.  Cardiovascular: S1 normal and S2 normal. Exam reveals no gallop.  No murmur heard. Pulses:      Dorsalis pedis pulses are 2+ on the right side and 2+ on the left side.   Respiratory: No respiratory distress. She has no wheezes. She has no rhonchi. She has no rales.  GI: Soft. Bowel sounds are normal. There is no abdominal tenderness.  Musculoskeletal:     Right ankle: She exhibits no swelling.     Left ankle: She exhibits no swelling.  Lymphadenopathy:    She has no cervical adenopathy.  Neurological: She is alert. No cranial nerve deficit.  Skin: Skin is warm. No rash noted. Nails show no clubbing.  Psychiatric: She has a normal mood and affect.      Data Reviewed: Basic Metabolic Panel: Recent Labs  Lab 04/16/19 0545 04/17/19 0526 04/18/19 0521 04/19/19 0511 04/20/19 0559  NA 135 136 137 137 138  K 4.0 3.9 3.9 4.0 3.8  CL 102 102 102 100 103  CO2 24 22 25 26 26   GLUCOSE 164* 138* 122* 140* 140*  BUN 32* 27* 28* 32* 34*  CREATININE 1.19* 1.12* 1.05* 1.10* 1.18*  CALCIUM 8.2* 8.6* 8.7* 8.5* 8.6*   Liver Function Tests: Recent Labs  Lab 04/15/19 0638  AST 24  ALT 18  ALKPHOS 42  BILITOT 0.7  PROT 6.8  ALBUMIN 3.9   CBC: Recent Labs  Lab 04/15/19 0638 04/16/19 0545 04/17/19 0526 04/18/19 0521 04/19/19 0511 04/20/19 0559  WBC 10.0 7.2 9.0 10.6* 10.6* 11.1*  NEUTROABS 6.6  --   --   --   --   --   HGB 13.1 7.5* 7.3* 7.5* 7.1* 9.0*  HCT 37.6 22.2* 20.6* 20.8* 20.5* 26.3*  MCV 88.7 92.5 87.3 88.1 89.1 91.6  PLT 173 123* 126* 135* 170 191    CBG: Recent Labs  Lab 04/19/19 1359 04/19/19 1733 04/19/19 2124 04/20/19 0820 04/20/19 1136  GLUCAP 136* 123* 143* 124* 167*    Recent Results (from the past 240 hour(s))  SARS Coronavirus 2 by RT PCR (hospital order, performed [facility]) Nasopharyngeal Nasopharyngeal Swab     Status: None   Collection Time: 04/15/19  8:09 AM   Specimen: Nasopharyngeal Swab  Result Value Ref Range Status   SARS Coronavirus 2 NEGATIVE NEGATIVE Final    Comment: (NOTE) If result is NEGATIVE SARS-CoV-2 target nucleic acids are NOT DETECTED. The SARS-CoV-2 RNA is generally  detectable in upper and lower  respiratory specimens during the acute phase of infection. The lowest  concentration of SARS-CoV-2 viral copies this assay can detect is 250  copies / mL. A negative result does not preclude SARS-CoV-2 infection  and should not be used as the sole basis for treatment or other  patient management decisions.  A negative result may occur with  improper specimen collection / handling, submission of specimen other  than nasopharyngeal swab, presence of viral mutation(s) within the  areas targeted by this assay, and inadequate number of viral copies  (<250 copies / mL). A negative result must be combined with clinical  observations, patient history, and epidemiological information. If result is POSITIVE SARS-CoV-2 target nucleic acids are DETECTED. The SARS-CoV-2 RNA is generally detectable in upper and lower  respiratory specimens dur ing the acute phase of infection.  Positive  results are indicative of active infection with SARS-CoV-2.  Clinical  correlation with patient history and other diagnostic information is  necessary to determine patient infection status.  Positive results do  not rule out bacterial infection or co-infection with other viruses. If result is PRESUMPTIVE POSTIVE SARS-CoV-2 nucleic acids MAY BE PRESENT.   A presumptive positive result was obtained on the submitted specimen  and confirmed on repeat testing.  While 2019 novel coronavirus  (SARS-CoV-2) nucleic acids may be present in the submitted sample  additional confirmatory testing may be necessary for epidemiological  and / or clinical management purposes  to differentiate between  SARS-CoV-2 and other Sarbecovirus currently known to infect humans.  If clinically indicated additional testing with an alternate test  methodology [phone]([id number]) is advised. The SARS-CoV-2 RNA is generally  detectable in upper and lower respiratory sp ecimens during the acute  phase of infection. The  expected result is Negative. Fact Sheet for Patients:  BoilerBrush.com.cyhttps://www.fda.gov/media/136312/download Fact Sheet for Healthcare Providers: https://pope.com/https://www.fda.gov/media/136313/download This test is not yet approved or cleared by the [place] FDA and has been authorized for detection and/or diagnosis of SARS-CoV-2 by FDA under an Emergency Use Authorization (EUA).  This EUA will remain in effect (meaning this test can be used) for the duration of the COVID-19 declaration under Section 564(b)(1) of the Act, 21 U.S.C. section 360bbb-3(b)(1), unless the authorization is terminated or revoked sooner. Performed at [facility], [address] [place], [place] [address]       Scheduled Meds: . acetaminophen  1,000 mg Oral Q8H  . aspirin EC  81 mg Oral Daily  . docusate sodium  100 mg Oral Daily  . enoxaparin (LOVENOX) injection  40 mg Subcutaneous Q24H  . ferrous sulfate  325 mg Oral Q breakfast  . gabapentin  100 mg Oral QHS  . insulin aspart  0-9 Units Subcutaneous TID WC  . latanoprost  1 drop Both Eyes QHS  . magnesium oxide  400 mg Oral Daily  . multivitamin-lutein  1 capsule Oral Daily  . pantoprazole  40 mg Oral BID  . pravastatin  20 mg Oral q1800  . senna-docusate  1 tablet Oral BID  . sodium chloride flush  3 mL Intravenous Q12H  . vitamin B-12  1,000 mcg Oral Daily   Continuous Infusions: . ondansetron (ZOFRAN) IV      Assessment/Plan:  1. Orthostatic hypotension.  Hold lisinopril HCT and Toprol.  Advised eating and drinking and staying hydrated.  Place TED hose.  Would rather have her blood pressure higher because when she drops down I do not want her to pass out. 2. Right hip fracture requiring operative repair.  Status post ORIF on 04/15/2019.  Pain control.  Try to convert to Tylenol as quick as possible. 3. Postoperative anemia.  Hemoglobin up to 9.0 after IV Venofer yesterday and blood transfusion. 4. Essential hypertension.  Since the patient is  orthostatic I got rid of all antihypertensive medications.  Rather have blood pressure on the higher side.  Must check orthostatics at each visit. 5. Type 2 diabetes mellitus with neuropathy chronic kidney disease stage IIIa hyperlipidemia.  On sliding scale here.  Can restart Actos as outpatient.  On pravastatin and gabapentin. 6. Nonsustained SVT.  With orthostatic hypotension hold low-dose metoprolol.  Code Status:     Code Status Orders  (From admission, onward)         Start     Ordered   04/15/19 0923  Full code  Continuous     04/15/19 0926        Code Status History    Date Active Date Inactive Code Status Order ID Comments User Context   04/15/2019 0926 04/15/2019 0926 Full Code [id number]  Gertha Calkin, MD ED   Advance Care Planning Activity    Advance Directive Documentation     Most Recent Value  Type of Advance Directive  Healthcare Power of Attorney  Pre-existing out of facility DNR order (yellow form or pink MOST form)  -  "MOST" Form in Place?  -     Family Communication: Spoke with son at the bedside Disposition Plan: Likely out to rehab tomorrow.  They were unable to accept on Thanksgiving day.  Consultants:  Orthopedic surgery  Procedures:  Right hip ORIF  Time spent: 28 minutes  Asriel [employer]

## 2019-04-20 NOTE — Progress Notes (Signed)
Subjective: 5 Days Post-Op Procedure(s) (LRB): INTRAMEDULLARY (IM) NAIL INTERTROCHANTRIC (Right)  Patient presents with her son at bedside.  State she is feeling better today compared to yesterday. Patient reports pain as well-controlled.   Patient is well, and has had no acute complaints or problems Patient received one unit of PRBC yesterday. Negative for chest pain and shortness of breath Fever: no Gastrointestinal: negative for nausea and vomiting.  Objective: Vital signs in last 24 hours: Temp:  [97.7 F (36.5 C)-98.9 F (37.2 C)] 98.7 F (37.1 C) (11/26 0817) Pulse Rate:  [84-93] 89 (11/26 0817) Resp:  [14-16] 14 (11/26 0616) BP: (128-171)/(48-67) 153/63 (11/26 0817) SpO2:  [89 %-96 %] 96 % (11/26 0817) Weight:  [77.2 kg] 77.2 kg (11/26 0616)  Intake/Output from previous day:  Intake/Output Summary (Last 24 hours) at 04/20/2019 0935 Last data filed at 04/19/2019 2145 Gross per 24 hour  Intake 330 ml  Output 950 ml  Net -620 ml    Intake/Output this shift: No intake/output data recorded.  Labs: Recent Labs    04/18/19 0521 04/19/19 0511 04/20/19 0559  HGB 7.5* 7.1* 9.0*   Recent Labs    04/19/19 0511 04/20/19 0559  WBC 10.6* 11.1*  RBC 2.30* 2.87*  HCT 20.5* 26.3*  PLT 170 191   Recent Labs    04/19/19 0511 04/20/19 0559  NA 137 138  K 4.0 3.8  CL 100 103  CO2 26 26  BUN 32* 34*  CREATININE 1.10* 1.18*  GLUCOSE 140* 140*  CALCIUM 8.5* 8.6*   No results for input(s): LABPT, INR in the last 72 hours.  EXAM General - Patient is Alert, Appropriate and Oriented Extremity - Neurovascular intact Sensation intact distally Intact pulses distally Dorsiflexion/Plantar flexion intact Incision: dressing C/D/I No cellulitis present Compartment soft Dressing/Incision -clean, dry, no drainage Motor Function - intact, moving foot and toes well on exam. Able to actively extend knee.   Assessment/Plan: 5 Days Post-Op Procedure(s)  (LRB): INTRAMEDULLARY (IM) NAIL INTERTROCHANTRIC (Right) Principal Problem:   Syncope and collapse Active Problems:   Closed hip fracture requiring operative repair with routine healing   Hypertension   Diabetes mellitus without complication (HCC)   Closed intertrochanteric fracture of hip, left, initial encounter ([place])   Syncope, vasovagal   Type 2 diabetes mellitus with stage 3a chronic kidney disease, without long-term current use of insulin (HCC)   Diabetic polyneuropathy associated with type 2 diabetes mellitus ([place])   Type 2 diabetes mellitus with hyperlipidemia (HCC)   Paroxysmal SVT (supraventricular tachycardia) (HCC)  Estimated body mass index is 29.2 kg/m as calculated from the following:   Height as of this encounter: 5\' 4"  (1.626 m).   Weight as of this encounter: 77.2 kg. Advance diet Up with therapy  Patient Hg 9.0 today after transfusion yesterday. Continue with therapy today. Patient has had a BM. Plan for possible d/c today to [facility]. Follow-up in two weeks for staple removal.  DVT Prophylaxis - Lovenox, Ted hose and SCDs Weight-Bearing as tolerated to right leg  J. Cameron Proud, PA-C [facility] 04/20/2019, 9:35 AM

## 2019-04-20 NOTE — Progress Notes (Signed)
Physical Therapy Treatment Patient Details Name: Amy Reid MRN: [id number] DOB: [birth year] Today's Date: 04/20/2019    History of Present Illness From MD notes: Pt is an 85 y.o. female with medical history significant of HTN, Rec TIA, DM II seen in ED for syncope and fall and rt hip fracture.  Pt is s/p closed reduction and short cephalomedullary nail of a R intertrochanteric femur fracture.  MD assessment also includes: post op blood loss anemia, syncope, non-sustained SVT, DM with neuropathy, HTN, and CKD III.    PT Comments    Pt ready for session.  Awake. Orthostatic BP's taken and recorded in flow sheets. Supine 152/74 P 89 Sitting 132/77 P 98 Standing 101/52 P 109 Standing at 3 minutes 98/70 P 115 Returned to supine 148/62 P 84  Pt remains orthostatic during session and requested to lay back down due to feeling "weak"  Denies dizziness.    Follow Up Recommendations  SNF     Equipment Recommendations  Rolling walker with 5" wheels;3in1 (PT)    Recommendations for Other Services       Precautions / Restrictions Precautions Precautions: Fall Precaution Comments: Blind in R eye Restrictions Weight Bearing Restrictions: Yes RLE Weight Bearing: Weight bearing as tolerated    Mobility  Bed Mobility Overal bed mobility: Needs Assistance Bed Mobility: Supine to Sit;Sit to Supine     Supine to sit: Mod assist Sit to supine: Max assist;Mod assist      Transfers Overall transfer level: Needs assistance Equipment used: Rolling walker (2 wheeled) Transfers: Sit to/from Stand Sit to Stand: From elevated surface;Min assist            Ambulation/Gait             General Gait Details: unable to step in place but does weight shift   Stairs             Wheelchair Mobility    Modified Rankin (Stroke Patients Only)       Balance Overall balance assessment: Needs assistance Sitting-balance support: Feet supported Sitting balance-Leahy  Scale: Fair Sitting balance - Comments: supervision for safety   Standing balance support: Bilateral upper extremity supported Standing balance-Leahy Scale: Poor Standing balance comment: Occasional min A to prevent posterior LOB in standing                            Cognition Arousal/Alertness: Awake/alert Behavior During Therapy: WFL for tasks assessed/performed Overall Cognitive Status: Within Functional Limits for tasks assessed                                        Exercises Total Joint Exercises Ankle Circles/Pumps: Strengthening;Both;10 reps;Other reps (comment)(2x10 reps with manual resistance) Quad Sets: Strengthening;Both;10 reps;Other reps (comment)(2x10 reps) Gluteal Sets: Strengthening;Both;10 reps;Other reps (comment)(2x10 reps) Towel Squeeze: Strengthening;Both;5 reps Short Arc Quad: Strengthening;Both;10 reps;Other reps (comment)(2x10 reps with manual resistance) Heel Slides: AAROM;Both;10 reps;Other reps (comment)(2x10 reps) Hip ABduction/ADduction: AAROM;Both;10 reps(2x10 reps) Straight Leg Raises: AAROM;Both;10 reps;Other reps (comment)(2x10 reps)    General Comments        Pertinent Vitals/Pain Pain Assessment: Faces Faces Pain Scale: Hurts little more Pain Location: R hip Pain Descriptors / Indicators: Aching;Sore Pain Intervention(s): Limited activity within patient's tolerance;Repositioned    Home Living  Prior Function            PT Goals (current goals can now be found in the care plan section) Progress towards PT goals: Progressing toward goals    Frequency    BID      PT Plan Current plan remains appropriate    Co-evaluation              AM-PAC PT "6 Clicks" Mobility   Outcome Measure  Help needed turning from your back to your side while in a flat bed without using bedrails?: A Lot Help needed moving from lying on your back to sitting on the side of a flat bed  without using bedrails?: A Little Help needed moving to and from a bed to a chair (including a wheelchair)?: A Lot Help needed standing up from a chair using your arms (e.g., wheelchair or bedside chair)?: A Little Help needed to walk in hospital room?: Total Help needed climbing 3-5 steps with a railing? : Total 6 Click Score: 12    End of Session Equipment Utilized During Treatment: Gait belt Activity Tolerance: Patient limited by fatigue;Treatment limited secondary to medical complications (Comment) Patient left: in bed;with call bell/phone within reach;with family/visitor present;with SCD's reapplied Nurse Communication: Mobility status       Time: 9179-1505 PT Time Calculation (min) (ACUTE ONLY): 20 min  Charges:  $Therapeutic Exercise: 8-22 mins                     Chesley Noon, PTA 04/20/19, 9:19 AM

## 2019-04-20 NOTE — Progress Notes (Signed)
D: Pt alert and oriented. Pt denies experiencing any pain AVH at this time.   A: Scheduled medications administered to pt, per MD orders. Support and encouragement provided. Frequent verbal contact made.   R: No adverse drug reactions noted. Pt complaint with medications and treatment plan. Pt interacts well with staff on the unit. Pt is stable at this time, will continue to monitor and provide care for as ordered.

## 2019-04-21 LAB — CBC
HCT: 25.7 % — ABNORMAL LOW (ref 36.0–46.0)
Hemoglobin: 9 g/dL — ABNORMAL LOW (ref 12.0–15.0)
MCH: 30.8 pg (ref 26.0–34.0)
MCHC: 35 g/dL (ref 30.0–36.0)
MCV: 88 fL (ref 80.0–100.0)
Platelets: 222 10*3/uL (ref 150–400)
RBC: 2.92 MIL/uL — ABNORMAL LOW (ref 3.87–5.11)
RDW: 15.6 % — ABNORMAL HIGH (ref 11.5–15.5)
WBC: 10.8 10*3/uL — ABNORMAL HIGH (ref 4.0–10.5)
nRBC: 1.1 % — ABNORMAL HIGH (ref 0.0–0.2)

## 2019-04-21 LAB — GLUCOSE, CAPILLARY: Glucose-Capillary: 131 mg/dL — ABNORMAL HIGH (ref 70–99)

## 2019-04-21 NOTE — Discharge Summary (Signed)
[facility] - [place] at [facility]   PATIENT NAME: Amy Reid    MR#:  [id number]  DATE OF BIRTH:  04/20/1930  DATE OF ADMISSION:  04/15/2019 ADMITTING PHYSICIAN: Gertha CalkinEkta Patel V, MD  DATE OF DISCHARGE: 04/21/19  PRIMARY CARE PHYSICIAN: Marina GoodellFeldpausch, Dale E, MD    ADMISSION DIAGNOSIS:  Syncope and collapse [R55] Closed intertrochanteric fracture of hip, left, initial encounter [facility](HCC) [S72.142A] Intertrochanteric fracture of right hip, closed, initial encounter (HCC) [S72.141A]  DISCHARGE DIAGNOSIS:  Principal Problem:   Syncope and collapse Active Problems:   Closed hip fracture requiring operative repair, right, sequela   Hypertension   Diabetes mellitus without complication (HCC)   Closed intertrochanteric fracture of hip, left, initial encounter (HCC)   Syncope, vasovagal   Type 2 diabetes mellitus with stage 3a chronic kidney disease, without long-term current use of insulin (HCC)   Diabetic polyneuropathy associated with type 2 diabetes mellitus (HCC)   Type 2 diabetes mellitus with hyperlipidemia (HCC)   Paroxysmal SVT (supraventricular tachycardia) (HCC)   Orthostatic hypotension   SECONDARY DIAGNOSIS:   Past Medical History:  Diagnosis Date  . Diabetes mellitus without complication (HCC)   . Hypertension     HOSPITAL COURSE:   1.  Right hip fracture requiring operative repair.  Status post ORIF on 04/15/2019.  Pain control.  Try to convert to Tylenol as quick as possible.  Physical therapy at rehab.  Follow-up with orthopedic surgery as outpatient.  Lovenox for 14 days.  TED hose.  Weightbearing as tolerated. 2.  Orthostatic hypotension with history of essential hypertension.  Since the patient is orthostatic I got rid of lisinopril HCT and Toprol XL.  I advised eating and drinking and staying hydrated.  Continue TED hose.  I would recommend checking orthostatic vital signs on a daily basis.  Prior to starting any antihypertensive medication  need to check orthostatic vital signs.  I would rather have her blood pressure on the higher side (up to 170 systolic) because when she drops down she still needs to have a blood pressure that is high enough to perfuse her brain.  If patient continues to be orthostatic after we can consider adding low-dose midodrine 2.5 mg 3 times daily but I would not do that at this time. 3.  Postoperative anemia.  Hemoglobin up to 9.0 after IV Venofer and blood transfusion.  Continue oral iron as outpatient.  Recommend checking a CBC weekly. 4.  Type 2 diabetes with neuropathy, chronic kidney disease stage III 8, hyperlipidemia.  Can restart Actos as outpatient.  Check fingersticks daily.  Patient also on pravastatin and gabapentin. 5.  Nonsustained SVT.  This has not reoccurred on telemetry.  Since patient is orthostatic I stopped the low-dose Toprol. 6.  If long-term fatigue continues after rehab can consider cardiology consultation but at this point I will hold off.   DISCHARGE CONDITIONS:   Satisfactory  CONSULTS OBTAINED:  Treatment Team:  Donato HeinzHooten, James P, MD  DRUG ALLERGIES:   Allergies  Allergen Reactions  . Epinephrine Other (See Comments)    coma    DISCHARGE MEDICATIONS:   Allergies as of 04/21/2019      Reactions   Epinephrine Other (See Comments)   coma      Medication List    STOP taking these medications   lisinopril-hydrochlorothiazide 20-12.5 MG tablet Commonly known as: ZESTORETIC     TAKE these medications   acetaminophen 325 MG tablet Commonly known as: Tylenol Take 2 tablets (650 mg total) by mouth  every 4 (four) hours as needed.   aspirin EC 81 MG tablet Take 81 mg by mouth every Monday, Wednesday, and Friday.   Biotin 10 MG Caps Take 10 mg by mouth daily.   cetirizine 10 MG tablet Commonly known as: ZYRTEC Take 10 mg by mouth daily as needed for allergies.   docusate sodium 100 MG capsule Commonly known as: COLACE Take 1 capsule (100 mg total) by mouth  daily.   enoxaparin 40 MG/0.4ML injection Commonly known as: LOVENOX Inject 0.4 mLs (40 mg total) into the skin daily for 14 days.   ferrous sulfate 325 (65 FE) MG tablet Take 1 tablet (325 mg total) by mouth daily with breakfast.   gabapentin 100 MG capsule Commonly known as: NEURONTIN Take 100 mg by mouth at bedtime.   latanoprost 0.005 % ophthalmic solution Commonly known as: XALATAN Place 1 drop into both eyes at bedtime.   lovastatin 20 MG tablet Commonly known as: MEVACOR Take 20 mg by mouth at bedtime.   magnesium oxide 400 MG tablet Commonly known as: MAG-OX Take 400 mg by mouth daily.   Multi-Vitamin tablet Take 1 tablet by mouth daily.   oxyCODONE 5 MG immediate release tablet Commonly known as: Oxy IR/ROXICODONE Take 1 tablet (5 mg total) by mouth every 6 (six) hours as needed for moderate pain or severe pain.   pioglitazone 15 MG tablet Commonly known as: ACTOS Take 15 mg by mouth daily.   vitamin B-12 1000 MCG tablet Commonly known as: CYANOCOBALAMIN Take 1,000 mcg by mouth daily.        DISCHARGE INSTRUCTIONS:   Follow-up team at rehab 1 day Follow-up orthopedic surgery 1 to 2 weeks  If you experience worsening of your admission symptoms, develop shortness of breath, life threatening emergency, suicidal or homicidal thoughts you must seek medical attention immediately by calling 911 or calling your MD immediately  if symptoms less severe.  You Must read complete instructions/literature along with all the possible adverse reactions/side effects for all the Medicines you take and that have been prescribed to you. Take any new Medicines after you have completely understood and accept all the possible adverse reactions/side effects.   Please note  You were cared for by a hospitalist during your hospital stay. If you have any questions about your discharge medications or the care you received while you were in the hospital after you are discharged, you  can call the unit and asked to speak with the hospitalist on call if the hospitalist that took care of you is not available. Once you are discharged, your primary care physician will handle any further medical issues. Please note that NO REFILLS for any discharge medications will be authorized once you are discharged, as it is imperative that you return to your primary care physician (or establish a relationship with a primary care physician if you do not have one) for your aftercare needs so that they can reassess your need for medications and monitor your lab values.    Today   CHIEF COMPLAINT:   Chief Complaint  Patient presents with  . Fall  . Loss of Consciousness    HISTORY OF PRESENT ILLNESS:  Amy Reid  is a 85 y.o. female came in with fall and loss of consciousness   VITAL SIGNS:  Blood pressure 139/73, pulse 86, temperature 98.3 F (36.8 C), temperature source Oral, resp. rate 17, height  (1.626 m), weight 77.2 kg, SpO2 92 %.   PHYSICAL EXAMINATION:  GENERAL:  84 y.o.-year-old patient lying in the bed with no acute distress.  EYES: Pupils equal, round, reactive to light and accommodation. No scleral icterus. Extraocular muscles intact.  HEENT: Head atraumatic, normocephalic. Oropharynx and nasopharynx clear.  NECK:  Supple, no jugular venous distention. No thyroid enlargement, no tenderness.  LUNGS: Normal breath sounds bilaterally, no wheezing, rales,rhonchi or crepitation. No use of accessory muscles of respiration.  CARDIOVASCULAR: S1, S2 normal. No murmurs, rubs, or gallops.  ABDOMEN: Soft, non-tender, non-distended. Bowel sounds present. No organomegaly or mass.  EXTREMITIES: No pedal edema, cyanosis, or clubbing.  NEUROLOGIC: Cranial nerves II through XII are intact. Muscle strength 5/5 in all extremities. Sensation intact. Gait not checked.  PSYCHIATRIC: The patient is alert and oriented x 3.  SKIN: No obvious rash, lesion, or ulcer.   DATA REVIEW:    CBC Recent Labs  Lab 04/21/19 0605  WBC 10.8*  HGB 9.0*  HCT 25.7*  PLT 222    Chemistries  Recent Labs  Lab 04/15/19 0638  04/20/19 0559  NA 138   < > 138  K 3.5   < > 3.8  CL 104   < > 103  CO2 24   < > 26  GLUCOSE 164*   < > 140*  BUN 39*   < > 34*  CREATININE 1.39*   < > 1.18*  CALCIUM 9.4   < > 8.6*  AST 24  --   --   ALT 18  --   --   ALKPHOS 42  --   --   BILITOT 0.7  --   --    < > = values in this interval not displayed.    Microbiology Results  Results for orders placed or performed during the hospital encounter of 04/15/19  SARS Coronavirus 2 by RT PCR (hospital order, performed in [facility] hospital lab) Nasopharyngeal Nasopharyngeal Swab     Status: None   Collection Time: 04/15/19  8:09 AM   Specimen: Nasopharyngeal Swab  Result Value Ref Range Status   SARS Coronavirus 2 NEGATIVE NEGATIVE Final    Comment: (NOTE) If result is NEGATIVE SARS-CoV-2 target nucleic acids are NOT DETECTED. The SARS-CoV-2 RNA is generally detectable in upper and lower  respiratory specimens during the acute phase of infection. The lowest  concentration of SARS-CoV-2 viral copies this assay can detect is 250  copies / mL. A negative result does not preclude SARS-CoV-2 infection  and should not be used as the sole basis for treatment or other  patient management decisions.  A negative result may occur with  improper specimen collection / handling, submission of specimen other  than nasopharyngeal swab, presence of viral mutation(s) within the  areas targeted by this assay, and inadequate number of viral copies  (<250 copies / mL). A negative result must be combined with clinical  observations, patient history, and epidemiological information. If result is POSITIVE SARS-CoV-2 target nucleic acids are DETECTED. The SARS-CoV-2 RNA is generally detectable in upper and lower  respiratory specimens dur ing the acute phase of infection.  Positive  results are indicative of  active infection with SARS-CoV-2.  Clinical  correlation with patient history and other diagnostic information is  necessary to determine patient infection status.  Positive results do  not rule out bacterial infection or co-infection with other viruses. If result is PRESUMPTIVE POSTIVE SARS-CoV-2 nucleic acids MAY BE PRESENT.   A presumptive positive result was obtained on the submitted specimen  and confirmed on repeat testing.  While  2019 novel coronavirus  (SARS-CoV-2) nucleic acids may be present in the submitted sample  additional confirmatory testing may be necessary for epidemiological  and / or clinical management purposes  to differentiate between  SARS-CoV-2 and other Sarbecovirus currently known to infect humans.  If clinically indicated additional testing with an alternate test  methodology [phone]) is advised. The SARS-CoV-2 RNA is generally  detectable in upper and lower respiratory sp ecimens during the acute  phase of infection. The expected result is Negative. Fact Sheet for Patients:  StrictlyIdeas.no Fact Sheet for Healthcare Providers: BankingDealers.co.za This test is not yet approved or cleared by the [place] FDA and has been authorized for detection and/or diagnosis of SARS-CoV-2 by FDA under an Emergency Use Authorization (EUA).  This EUA will remain in effect (meaning this test can be used) for the duration of the COVID-19 declaration under Section 564(b)(1) of the Act, 21 U.S.C. section 360bbb-3(b)(1), unless the authorization is terminated or revoked sooner. Performed at [facility], [address] [place], [place] [address]      Management plans discussed with the patient, family and they are in agreement.  CODE STATUS:     Code Status Orders  (From admission, onward)         Start     Ordered   04/15/19 0923  Full code  Continuous     04/15/19 0926        Code Status  History    Date Active Date Inactive Code Status Order ID Comments User Context   04/15/2019 0926 04/15/2019 0926 Full Code [id number]  Para Skeans, MD ED   Advance Care Planning Activity    Advance Directive Documentation     Most Recent Value  Type of Advance Directive  Healthcare Power of Attorney  Pre-existing out of facility DNR order (yellow form or pink MOST form)  -  "MOST" Form in Place?  -      TOTAL TIME TAKING CARE OF THIS PATIENT: 35 minutes.    Loletha Grayer M.D on 04/21/2019 at 9:38 AM  Between 7am to 6pm - Pager - [phone]  After 6pm go to [email or web address] - password [email or web address]  [facility]  CC: Primary care physician; Sofie Hartigan, MD

## 2019-04-21 NOTE — Progress Notes (Signed)
Attempt to call report to [employer]. Stated to call back later. Provided facility with call back number. EMS called for transport.

## 2019-04-21 NOTE — Progress Notes (Signed)
Physical Therapy Treatment Patient Details Name: Amy Reid MRN: [id number] DOB: 28-Jan-1930 Today's Date: 04/21/2019    History of Present Illness From MD notes: Pt is an 85 y.o. female with medical history significant of HTN, Rec TIA, DM II seen in ED for syncope and fall and rt hip fracture.  Pt is s/p closed reduction and short cephalomedullary nail of a R intertrochanteric femur fracture.  Pt received transfusion 04/19/19.  MD assessment also includes: post op blood loss anemia, syncope, non-sustained SVT, DM with neuropathy, HTN, and CKD III.    PT Comments    Pt presented with deficits in strength, transfers, mobility, gait, balance, and activity tolerance.  Pt's BP in supine 123/54 and 151/66 in sitting.  Upon standing pt reported feeling "like I'm going to pass out".  Pt returned to sitting with BP taken at 146/60.  Pt reported that she felt ok to attempt to stand again but once in standing pt again required to return to sitting secondary to symptoms.  Both standing sessions lasted 10-15 sec before pt required to return to sitting.  Overall pt more alert during the session and required decreased assistance with bed mobility tasks but limited by orthostatic-like symptoms in standing although pt unable to tolerate standing long enough to obtain standing BP.  Pt will benefit from PT services in a SNF setting upon discharge to safely address above deficits for decreased caregiver assistance and eventual return to PLOF.     Follow Up Recommendations  SNF     Equipment Recommendations  Rolling walker with 5" wheels;3in1 (PT)    Recommendations for Other Services       Precautions / Restrictions Precautions Precautions: Fall Precaution Comments: Blind in R eye Restrictions Weight Bearing Restrictions: Yes RLE Weight Bearing: Weight bearing as tolerated Other Position/Activity Restrictions: Watch for orthostatic hypotension    Mobility  Bed Mobility Overal bed mobility:  Needs Assistance Bed Mobility: Rolling;Sidelying to Sit;Sit to Sidelying Rolling: Min assist Sidelying to sit: Min assist     Sit to sidelying: Min assist General bed mobility comments: Min verbal and tactile cues for sequencing with Min A for BLEs in and out of bed  Transfers Overall transfer level: Needs assistance Equipment used: Rolling walker (2 wheeled) Transfers: Sit to/from Stand Sit to Stand: From elevated surface;Min assist         General transfer comment: Min verbal and tactile cues for sequencing  Ambulation/Gait             General Gait Details: Unable   Stairs             Wheelchair Mobility    Modified Rankin (Stroke Patients Only)       Balance Overall balance assessment: Needs assistance Sitting-balance support: Feet supported Sitting balance-Leahy Scale: Good     Standing balance support: Bilateral upper extremity supported Standing balance-Leahy Scale: Poor Standing balance comment: Occasional min A to prevent posterior LOB in standing                            Cognition Arousal/Alertness: Awake/alert Behavior During Therapy: WFL for tasks assessed/performed Overall Cognitive Status: Within Functional Limits for tasks assessed                                        Exercises Total Joint Exercises Ankle Circles/Pumps: Strengthening;Both;10 reps;Other reps (comment)(2  x 10 reps with manual resistance.) Quad Sets: Strengthening;Both;10 reps;Other reps (comment)(2 x 10 reps) Gluteal Sets: Strengthening;Both;10 reps;Other reps (comment)(2 x 10 reps) Towel Squeeze: Strengthening;Both;10 reps Heel Slides: AAROM;Both;10 reps;AROM Hip ABduction/ADduction: AAROM;Both;10 reps Straight Leg Raises: AAROM;Both;10 reps Long Arc Quad: AROM;Both;10 reps;5 reps Knee Flexion: AROM;Both;10 reps;5 reps Marching in Standing: AROM;5 reps;Seated;Both Other Exercises Other Exercises: Bilateral supine leg press with  gentle manual resistance x 10    General Comments        Pertinent Vitals/Pain Pain Assessment: 0-10 Pain Score: 3  Pain Location: R hip Pain Descriptors / Indicators: Aching;Sore Pain Intervention(s): Premedicated before session;Monitored during session    Home Living                      Prior Function            PT Goals (current goals can now be found in the care plan section) Progress towards PT goals: Progressing toward goals    Frequency    BID      PT Plan Current plan remains appropriate    Co-evaluation              AM-PAC PT "6 Clicks" Mobility   Outcome Measure  Help needed turning from your back to your side while in a flat bed without using bedrails?: A Lot Help needed moving from lying on your back to sitting on the side of a flat bed without using bedrails?: A Little Help needed moving to and from a bed to a chair (including a wheelchair)?: A Lot Help needed standing up from a chair using your arms (e.g., wheelchair or bedside chair)?: A Little Help needed to walk in hospital room?: Total Help needed climbing 3-5 steps with a railing? : Total 6 Click Score: 12    End of Session Equipment Utilized During Treatment: Gait belt Activity Tolerance: Patient limited by fatigue;Treatment limited secondary to medical complications (Comment) Patient left: in bed;with call bell/phone within reach;with family/visitor present;with nursing/sitter in room;Other (comment)(Nursing entered room at end of session to prepare patient for discharge) Nurse Communication: Mobility status PT Visit Diagnosis: Unsteadiness on feet (R26.81);History of falling (Z91.81);Other abnormalities of gait and mobility (R26.89);Muscle weakness (generalized) (M62.81)     Time: 6010-9323 PT Time Calculation (min) (ACUTE ONLY): 29 min  Charges:  $Therapeutic Exercise: 23-37 mins                     D. Scott Tyra Michelle PT, DPT 04/21/19, 11:52 AM

## 2019-04-21 NOTE — Care Management Important Message (Signed)
Important Message  Patient Details  Name: Amy Reid MRN: [id number] Date of Birth: Jan 29, 1930   Medicare Important Message Given:  Yes     Dannette Barbara 04/21/2019, 11:22 AM

## 2019-04-21 NOTE — TOC Transition Note (Signed)
Transition of Care [facility]) - CM/SW Discharge Note   Patient Details  Name: SHALIYAH TAITE MRN: [id number] Date of Birth: 01-Apr-1930  [facility] CM/SW Contact:  Su Hilt, RN Phone Number: 04/21/2019, 9:50 AM   Clinical Narrative:    Patient to DC today to [place] room 508 via EMS for transport, The bedside nurse to call report to [employer] and call EMS to transport once ready, son aware   Final next level of care: Skilled Nursing Facility Barriers to Discharge: Barriers Resolved   Patient Goals and CMS Choice Patient states their goals for this hospitalization and ongoing recovery are:: get home      Discharge Placement                       Discharge Plan and Services                                     Social Determinants of Health (SDOH) Interventions     Readmission Risk Interventions No flowsheet data found.

## 2019-09-23 DEATH — deceased

## 2021-03-15 IMAGING — XA DG HIP (WITH PELVIS) OPERATIVE*R*
4 series · 4 of 4 positions shown · non-contrast
Comparison: None.

CLINICAL DATA: 89-year-old female

EXAM:
OPERATIVE RIGHT HIP (WITH PELVIS IF PERFORMED)  VIEWS
TECHNIQUE: Fluoroscopic spot image(s) were submitted for interpretation
post-operatively.

[Series 17: cont. · 1 of 1 slices shown (1 of 4)]
[im 1/1]
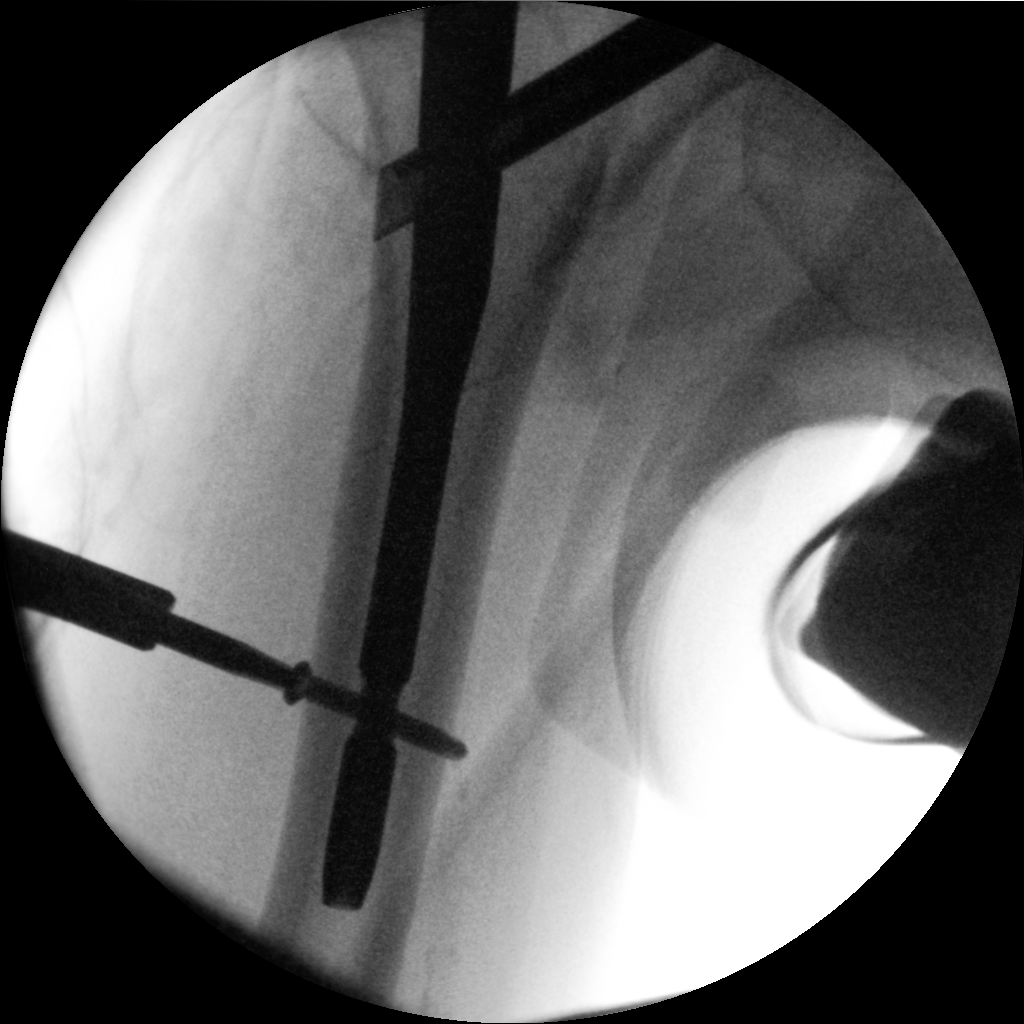

[Series 18: cont. · 1 of 1 slices shown (2 of 4)]
[im 1/1]
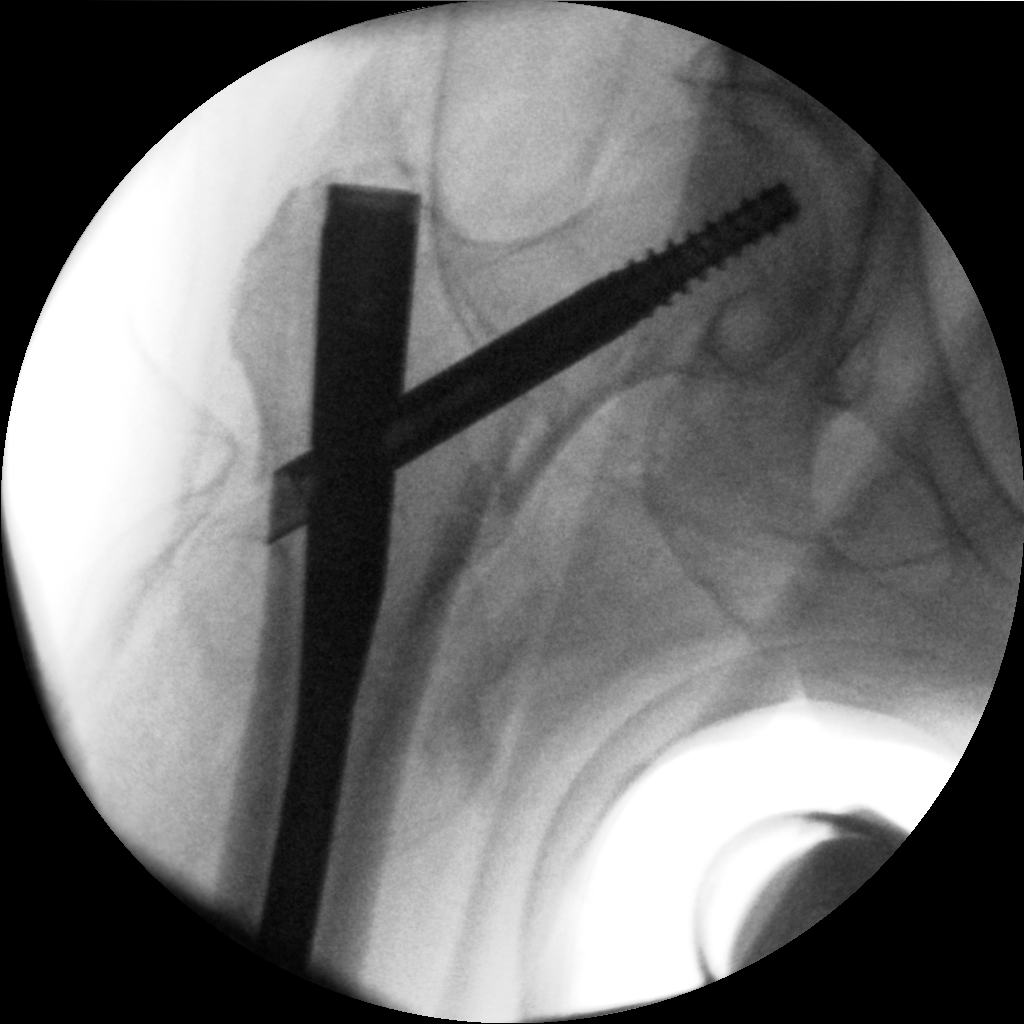

[Series 19: cont. · 1 of 1 slices shown (3 of 4)]
[im 1/1]
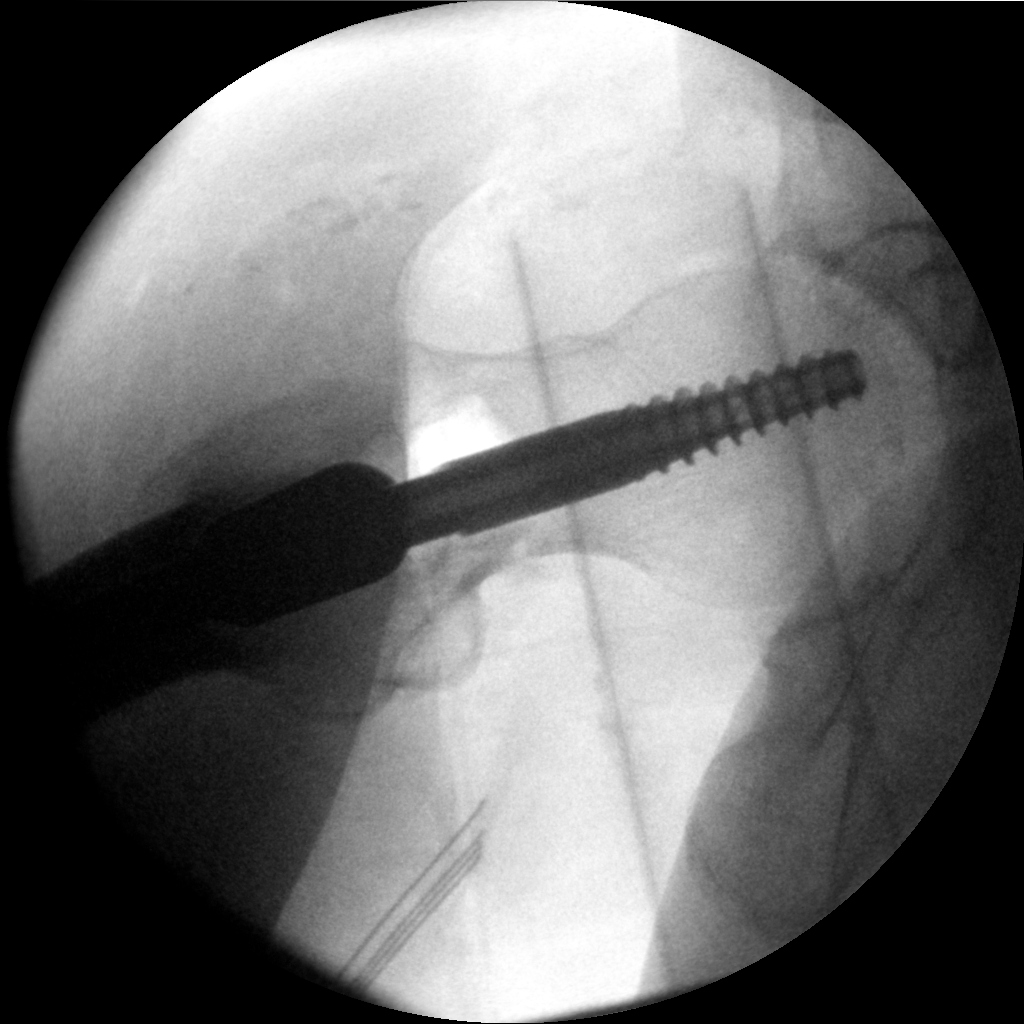

[Series 20: cont. · 1 of 1 slices shown (4 of 4)]
[im 1/1]
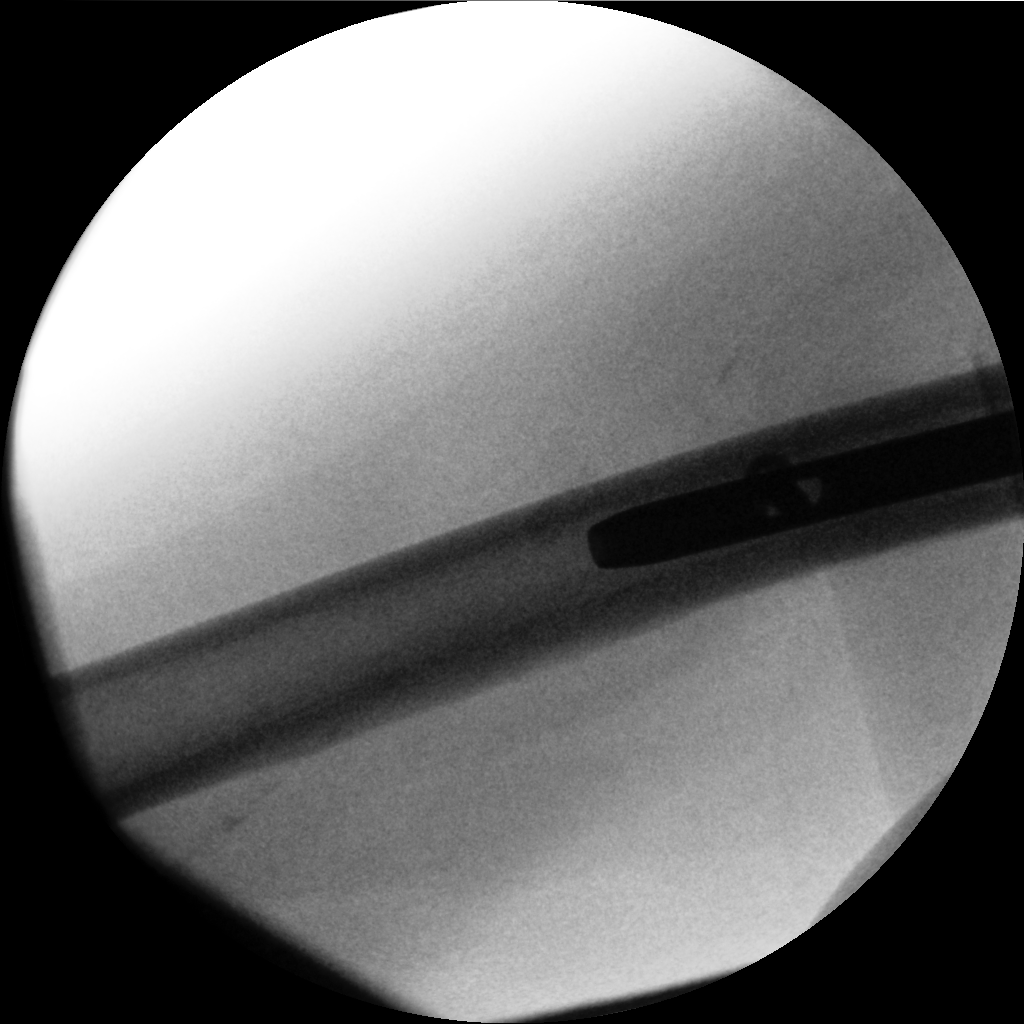

[4 of 4 positions shown; findings below may reference images not displayed]

FINDINGS: Intraoperative saved images obtained during open reduction and
internal fixation of the intertrochanteric right femoral neck
fracture. An intramedullary nail with a single distal interlocking
screw and a transfemoral neck lag screw have been placed. Alignment
of the fracture fragments is improved compared to the preoperative
radiographs. No evidence of immediate hardware complication.
IMPRESSION: In progress ORIF of intercondylar right femoral neck fracture
without evidence of immediate complication.
